# Patient Record
Sex: Female | Born: 1990 | Race: White | Hispanic: No | Marital: Single | Smoking: Current every day smoker
Health system: Southern US, Community
[De-identification: ages and names within clinical notes are randomized; demographics above are authoritative.]

## PROBLEM LIST (undated history)

## (undated) DIAGNOSIS — B958 Unspecified staphylococcus as the cause of diseases classified elsewhere: Secondary | ICD-10-CM

## (undated) DIAGNOSIS — L089 Local infection of the skin and subcutaneous tissue, unspecified: Secondary | ICD-10-CM

## (undated) DIAGNOSIS — F32A Depression, unspecified: Secondary | ICD-10-CM

## (undated) DIAGNOSIS — F329 Major depressive disorder, single episode, unspecified: Secondary | ICD-10-CM

## (undated) DIAGNOSIS — F419 Anxiety disorder, unspecified: Secondary | ICD-10-CM

## (undated) DIAGNOSIS — B009 Herpesviral infection, unspecified: Secondary | ICD-10-CM

## (undated) HISTORY — PX: NO PAST SURGERIES: SHX2092

---
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
## 2011-09-22 ENCOUNTER — Emergency Department: Payer: Self-pay | Admitting: Emergency Medicine

## 2011-09-22 LAB — TROPONIN I: Troponin-I: <0.02 ng/mL

## 2011-09-22 LAB — COMPREHENSIVE METABOLIC PANEL
Albumin: 4.3 g/dL (ref 3.4–5.0)
Alkaline Phosphatase: 107 U/L (ref 50–136)
BUN: 12 mg/dL (ref 7–18)
Bilirubin,Total: 1.8 mg/dL — ABNORMAL HIGH (ref 0.2–1.0)
Chloride: 106 mmol/L (ref 98–107)
Creatinine: 1.04 mg/dL (ref 0.60–1.30)
Glucose: 85 mg/dL (ref 65–99)
Osmolality: 275 (ref 275–301)
SGPT (ALT): 15 U/L (ref 12–78)
Sodium: 138 mmol/L (ref 136–145)
Total Protein: 8.7 g/dL — ABNORMAL HIGH (ref 6.4–8.2)

## 2011-09-22 LAB — CBC
HCT: 41.8 % (ref 35.0–47.0)
HGB: 14.3 g/dL (ref 12.0–16.0)
MCH: 29.2 pg (ref 26.0–34.0)
MCV: 86 fL (ref 80–100)
RBC: 4.89 10*6/uL (ref 3.80–5.20)

## 2011-09-22 LAB — CK TOTAL AND CKMB (NOT AT ARMC): CK, Total: 39 U/L (ref 21–215)

## 2012-03-08 ENCOUNTER — Emergency Department: Payer: Self-pay | Admitting: Unknown Physician Specialty

## 2012-03-08 LAB — CBC
HGB: 12 g/dL (ref 12.0–16.0)
MCH: 27.6 pg (ref 26.0–34.0)
RBC: 4.33 10*6/uL (ref 3.80–5.20)
RDW: 13 % (ref 11.5–14.5)
WBC: 7.3 10*3/uL (ref 3.6–11.0)

## 2012-03-08 LAB — DRUG SCREEN, URINE
Amphetamines, Ur Screen: NEGATIVE (ref ?–1000)
Barbiturates, Ur Screen: NEGATIVE (ref ?–200)
Benzodiazepine, Ur Scrn: POSITIVE (ref ?–200)
Cannabinoid 50 Ng, Ur ~~LOC~~: POSITIVE (ref ?–50)
MDMA (Ecstasy)Ur Screen: NEGATIVE (ref ?–500)
Opiate, Ur Screen: POSITIVE (ref ?–300)
Phencyclidine (PCP) Ur S: NEGATIVE (ref ?–25)

## 2012-03-08 LAB — URINALYSIS, COMPLETE
Bacteria: NONE SEEN
Bilirubin,UR: NEGATIVE
Blood: NEGATIVE
Glucose,UR: NEGATIVE mg/dL (ref 0–75)
Ketone: NEGATIVE
Leukocyte Esterase: NEGATIVE
Nitrite: NEGATIVE
RBC,UR: 1 /HPF (ref 0–5)
Specific Gravity: 1.011 (ref 1.003–1.030)
Squamous Epithelial: <1

## 2012-03-08 LAB — COMPREHENSIVE METABOLIC PANEL
Albumin: 3.9 g/dL (ref 3.4–5.0)
Alkaline Phosphatase: 89 U/L (ref 50–136)
Bilirubin,Total: 0.3 mg/dL (ref 0.2–1.0)
Calcium, Total: 9.4 mg/dL (ref 8.5–10.1)
Chloride: 107 mmol/L (ref 98–107)
Co2: 25 mmol/L (ref 21–32)
Glucose: 93 mg/dL (ref 65–99)
Osmolality: 276 (ref 275–301)
SGPT (ALT): 19 U/L (ref 12–78)
Sodium: 139 mmol/L (ref 136–145)
Total Protein: 8.6 g/dL — ABNORMAL HIGH (ref 6.4–8.2)

## 2012-03-08 LAB — SALICYLATE LEVEL: Salicylates, Serum: 1.8 mg/dL

## 2012-03-08 LAB — TSH: Thyroid Stimulating Horm: 1.38 u[IU]/mL

## 2012-03-08 LAB — ACETAMINOPHEN LEVEL: Acetaminophen: 12 ug/mL

## 2012-03-08 LAB — ETHANOL: Ethanol: <3 mg/dL

## 2012-09-05 ENCOUNTER — Ambulatory Visit: Payer: Self-pay

## 2012-09-05 LAB — URINALYSIS, COMPLETE
Bilirubin,UR: NEGATIVE
Leukocyte Esterase: NEGATIVE
Nitrite: NEGATIVE
RBC,UR: >30 /HPF (ref 0–5)

## 2012-09-05 LAB — RAPID STREP-A WITH REFLX: Micro Text Report: NEGATIVE

## 2012-09-08 LAB — BETA STREP CULTURE(ARMC)

## 2013-01-19 ENCOUNTER — Ambulatory Visit: Payer: Self-pay | Admitting: Family Medicine

## 2013-04-22 ENCOUNTER — Emergency Department: Payer: Self-pay | Admitting: Emergency Medicine

## 2013-04-22 LAB — CBC WITH DIFFERENTIAL/PLATELET
BASOS ABS: 0 10*3/uL (ref 0.0–0.1)
BASOS PCT: 0.4 %
EOS ABS: 0.2 10*3/uL (ref 0.0–0.7)
Eosinophil %: 2 %
HCT: 40.5 % (ref 35.0–47.0)
HGB: 13.8 g/dL (ref 12.0–16.0)
Lymphocyte #: 2.1 10*3/uL (ref 1.0–3.6)
Lymphocyte %: 22.6 %
MCH: 29.1 pg (ref 26.0–34.0)
MCHC: 34 g/dL (ref 32.0–36.0)
MCV: 86 fL (ref 80–100)
MONOS PCT: 6.5 %
Monocyte #: 0.6 x10 3/mm (ref 0.2–0.9)
Neutrophil #: 6.5 10*3/uL (ref 1.4–6.5)
Neutrophil %: 68.5 %
Platelet: 133 10*3/uL — ABNORMAL LOW (ref 150–440)
RBC: 4.72 10*6/uL (ref 3.80–5.20)
RDW: 14.1 % (ref 11.5–14.5)
WBC: 9.5 10*3/uL (ref 3.6–11.0)

## 2013-04-22 LAB — BASIC METABOLIC PANEL
Anion Gap: 4 — ABNORMAL LOW (ref 7–16)
BUN: 11 mg/dL (ref 7–18)
Calcium, Total: 9.4 mg/dL (ref 8.5–10.1)
Chloride: 102 mmol/L (ref 98–107)
Co2: 28 mmol/L (ref 21–32)
Creatinine: 1 mg/dL (ref 0.60–1.30)
EGFR (African American): >60
EGFR (Non-African Amer.): >60
Glucose: 104 mg/dL — ABNORMAL HIGH (ref 65–99)
OSMOLALITY: 268 (ref 275–301)
POTASSIUM: 4 mmol/L (ref 3.5–5.1)
Sodium: 134 mmol/L — ABNORMAL LOW (ref 136–145)

## 2013-05-25 ENCOUNTER — Ambulatory Visit: Payer: Self-pay

## 2013-05-25 ENCOUNTER — Other Ambulatory Visit: Payer: Self-pay | Admitting: Anesthesiology

## 2013-05-25 LAB — DRUG SCREEN, URINE
Amphetamines, Ur Screen: NEGATIVE
Barbiturates, Ur Screen: NEGATIVE
Benzodiazepine, Ur Scrn: NEGATIVE
Cannabinoid 50 Ng, Ur ~~LOC~~: NEGATIVE
Cocaine Metabolite,Ur ~~LOC~~: POSITIVE
MDMA (Ecstasy)Ur Screen: NEGATIVE
Methadone, Ur Screen: NEGATIVE
Opiate, Ur Screen: NEGATIVE
Phencyclidine (PCP) Ur S: NEGATIVE
Tricyclic, Ur Screen: NEGATIVE

## 2013-05-25 LAB — PREGNANCY, URINE: Pregnancy Test, Urine: NEGATIVE m[IU]/mL

## 2013-05-25 LAB — RAPID STREP-A WITH REFLX: Micro Text Report: NEGATIVE

## 2013-05-28 LAB — BETA STREP CULTURE(ARMC)

## 2013-11-11 ENCOUNTER — Emergency Department: Payer: Self-pay | Admitting: Emergency Medicine

## 2013-11-11 LAB — CBC WITH DIFFERENTIAL/PLATELET
Basophil #: 0 10*3/uL (ref 0.0–0.1)
Basophil %: 0.3 %
EOS PCT: 1.1 %
Eosinophil #: 0.1 10*3/uL (ref 0.0–0.7)
HCT: 36.9 % (ref 35.0–47.0)
HGB: 12.6 g/dL (ref 12.0–16.0)
Lymphocyte #: 1.7 10*3/uL (ref 1.0–3.6)
Lymphocyte %: 23.6 %
MCH: 30 pg (ref 26.0–34.0)
MCHC: 34.1 g/dL (ref 32.0–36.0)
MCV: 88 fL (ref 80–100)
MONO ABS: 0.5 x10 3/mm (ref 0.2–0.9)
Monocyte %: 7 %
NEUTROS PCT: 68 %
Neutrophil #: 5 10*3/uL (ref 1.4–6.5)
Platelet: 154 10*3/uL (ref 150–440)
RBC: 4.19 10*6/uL (ref 3.80–5.20)
RDW: 12.4 % (ref 11.5–14.5)
WBC: 7.3 10*3/uL (ref 3.6–11.0)

## 2014-02-02 DIAGNOSIS — F32A Depression, unspecified: Secondary | ICD-10-CM | POA: Insufficient documentation

## 2014-03-09 ENCOUNTER — Ambulatory Visit: Payer: Self-pay | Admitting: Physician Assistant

## 2014-06-02 DIAGNOSIS — F1990 Other psychoactive substance use, unspecified, uncomplicated: Secondary | ICD-10-CM | POA: Insufficient documentation

## 2014-06-02 DIAGNOSIS — B182 Chronic viral hepatitis C: Secondary | ICD-10-CM

## 2014-06-02 DIAGNOSIS — F1520 Other stimulant dependence, uncomplicated: Secondary | ICD-10-CM | POA: Diagnosis present

## 2014-06-02 DIAGNOSIS — L02416 Cutaneous abscess of left lower limb: Secondary | ICD-10-CM | POA: Insufficient documentation

## 2015-07-22 DIAGNOSIS — A419 Sepsis, unspecified organism: Secondary | ICD-10-CM | POA: Diagnosis present

## 2017-11-09 DIAGNOSIS — J189 Pneumonia, unspecified organism: Secondary | ICD-10-CM | POA: Diagnosis present

## 2017-11-09 DIAGNOSIS — I214 Non-ST elevation (NSTEMI) myocardial infarction: Secondary | ICD-10-CM | POA: Insufficient documentation

## 2017-11-09 DIAGNOSIS — N179 Acute kidney failure, unspecified: Secondary | ICD-10-CM | POA: Insufficient documentation

## 2017-11-09 DIAGNOSIS — J90 Pleural effusion, not elsewhere classified: Secondary | ICD-10-CM | POA: Insufficient documentation

## 2017-11-17 DIAGNOSIS — F17200 Nicotine dependence, unspecified, uncomplicated: Secondary | ICD-10-CM | POA: Insufficient documentation

## 2017-12-02 ENCOUNTER — Other Ambulatory Visit
Admission: RE | Admit: 2017-12-02 | Discharge: 2017-12-02 | Disposition: A | Discharge status: Home / Self Care | Payer: Medicaid Other | Source: Ambulatory Visit | Attending: Family Medicine | Admitting: Family Medicine

## 2017-12-02 DIAGNOSIS — N179 Acute kidney failure, unspecified: Secondary | ICD-10-CM | POA: Insufficient documentation

## 2017-12-02 LAB — COMPREHENSIVE METABOLIC PANEL
ALT: 27 U/L (ref 0–44)
ANION GAP: 10 (ref 5–15)
AST: 29 U/L (ref 15–41)
Albumin: 3.8 g/dL (ref 3.5–5.0)
Alkaline Phosphatase: 90 U/L (ref 38–126)
CO2: 26 mmol/L (ref 22–32)
Calcium: 9.6 mg/dL (ref 8.9–10.3)
Chloride: 103 mmol/L (ref 98–111)
Creatinine, Ser: 0.92 mg/dL (ref 0.44–1.00)
GFR calc non Af Amer: >60 mL/min (ref >60)
GLUCOSE: 79 mg/dL (ref 70–99)
POTASSIUM: 3.5 mmol/L (ref 3.5–5.1)
Sodium: 139 mmol/L (ref 135–145)
TOTAL PROTEIN: 7.8 g/dL (ref 6.5–8.1)
Total Bilirubin: 0.5 mg/dL (ref 0.3–1.2)

## 2018-07-03 ENCOUNTER — Encounter: Payer: Self-pay | Admitting: *Deleted

## 2018-07-03 ENCOUNTER — Other Ambulatory Visit: Payer: Self-pay

## 2018-07-03 DIAGNOSIS — N3090 Cystitis, unspecified without hematuria: Secondary | ICD-10-CM | POA: Insufficient documentation

## 2018-07-03 DIAGNOSIS — R410 Disorientation, unspecified: Secondary | ICD-10-CM | POA: Diagnosis not present

## 2018-07-03 DIAGNOSIS — Y998 Other external cause status: Secondary | ICD-10-CM | POA: Insufficient documentation

## 2018-07-03 DIAGNOSIS — Y92019 Unspecified place in single-family (private) house as the place of occurrence of the external cause: Secondary | ICD-10-CM | POA: Insufficient documentation

## 2018-07-03 DIAGNOSIS — Y9389 Activity, other specified: Secondary | ICD-10-CM | POA: Diagnosis not present

## 2018-07-03 DIAGNOSIS — S0990XA Unspecified injury of head, initial encounter: Secondary | ICD-10-CM | POA: Diagnosis present
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
## 2018-07-04 ENCOUNTER — Emergency Department: Payer: Medicaid Other

## 2018-07-04 ENCOUNTER — Emergency Department
Admission: EM | Admit: 2018-07-04 | Discharge: 2018-07-04 | Disposition: A | Discharge status: Home / Self Care | Payer: Medicaid Other | Attending: Student in an Organized Health Care Education/Training Program | Admitting: Student in an Organized Health Care Education/Training Program

## 2018-07-04 DIAGNOSIS — N3090 Cystitis, unspecified without hematuria: Secondary | ICD-10-CM

## 2018-07-04 DIAGNOSIS — S0990XA Unspecified injury of head, initial encounter: Secondary | ICD-10-CM

## 2018-07-04 LAB — URINALYSIS, COMPLETE (UACMP) WITH MICROSCOPIC
Bilirubin Urine: NEGATIVE
Glucose, UA: NEGATIVE mg/dL
Ketones, ur: NEGATIVE mg/dL
Nitrite: NEGATIVE
Protein, ur: 100 mg/dL — AB
Specific Gravity, Urine: 1.023 (ref 1.005–1.030)
WBC, UA: >50 WBC/hpf — ABNORMAL HIGH (ref 0–5)
pH: 6 (ref 5.0–8.0)

## 2018-07-04 MED ORDER — CEPHALEXIN 500 MG PO CAPS: 500.0000 mg | ORAL_CAPSULE | Freq: Three times a day (TID) | ORAL | 0 refills | Status: AC

## 2018-07-04 MED ORDER — CEPHALEXIN 500 MG PO CAPS
500.0000 mg | ORAL_CAPSULE | Freq: Once | ORAL | Status: AC
  Administered 2018-07-04: 500 mg via ORAL
  Filled 2018-07-04: qty 1

## 2018-07-04 NOTE — Discharge Instructions (Signed)
In light of COVID-19 escalating in our community, [facility] in [place] has suspended in-person client appointments and has canceled all in-person events, workshops and member events for a time to be determined.  Visit our News & Updates page for virtual events and workshops!   Our staff is working remotely and we are still here to assist you!  You can reach [place] by email at [email or web address]  We will return voicemail messages left at  [phone].
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
## 2018-07-04 NOTE — ED Notes (Addendum)
POC URINE PREG NEGATIVE.  

## 2018-07-05 ENCOUNTER — Other Ambulatory Visit: Payer: Self-pay

## 2018-07-05 ENCOUNTER — Emergency Department
Admission: EM | Admit: 2018-07-05 | Discharge: 2018-07-05 | Discharge status: Absent without leave | Payer: Medicaid Other

## 2019-02-16 ENCOUNTER — Encounter: Payer: Self-pay | Admitting: Emergency Medicine

## 2019-02-16 ENCOUNTER — Other Ambulatory Visit: Payer: Self-pay

## 2019-02-16 ENCOUNTER — Ambulatory Visit
Admission: EM | Admit: 2019-02-16 | Discharge: 2019-02-16 | Disposition: A | Discharge status: Home / Self Care | Payer: Medicaid Other | Attending: Urgent Care | Admitting: Urgent Care

## 2019-02-16 DIAGNOSIS — Z7189 Other specified counseling: Secondary | ICD-10-CM | POA: Diagnosis present

## 2019-02-16 DIAGNOSIS — U071 COVID-19: Secondary | ICD-10-CM | POA: Insufficient documentation

## 2019-02-16 DIAGNOSIS — Z20822 Contact with and (suspected) exposure to covid-19: Secondary | ICD-10-CM

## 2019-02-16 MED ORDER — ONDANSETRON 4 MG PO TBDP: 4.0000 mg | ORAL_TABLET | Freq: Three times a day (TID) | ORAL | 0 refills | Status: DC | PRN

## 2019-02-16 MED ORDER — ONDANSETRON 4 MG PO TBDP
4.0000 mg | ORAL_TABLET | Freq: Once | ORAL | Status: AC
  Administered 2019-02-16: 20:00:00 4 mg via ORAL

## 2019-02-16 NOTE — ED Triage Notes (Signed)
Pt c/o diarrhea, vomiting, headache, body aches, fever (100.4) and fatigue. started about 3 days ago. She states she has had a sinus congestion for 2 months now and is taking antibiotic for this.

## 2019-02-16 NOTE — Discharge Instructions (Signed)
It was very nice seeing you today in clinic. Thank you for entrusting me with your care.  ° °Rest and stay HYDRATED. Water and electrolyte containing beverages (Gatorade, Pedialyte) are best to prevent dehydration and electrolyte abnormalities.  °Recommend warm salt water gargles, hard candies/lozenges, and hot tea with honey/lemon to help soothe the throat and reduce irritation.  °May use Tylenol and/or Ibuprofen as needed for pain/fever.   ° °You were tested for SARS-CoV-2 (novel coronavirus) today. Testing is performed by an outside lab ([facility]) and has variable turn around times ranging between 2-5 days. Current recommendations from the the CDC and [place] DHHS require that you remain out of work in order to quarantine at home until negative test results are have been received. In the event that your test results are positive, you will be contacted with further directives. These measures are being implemented out of an abundance of caution to prevent transmission and spread during the current SARS-CoV-2 pandemic. ° °Make arrangements to follow up with your regular doctor in 1 week for re-evaluation if not improving. If your symptoms/condition worsens, please seek follow up care either here or in the ER. Please remember, our [place] providers are "right here with you" when you need us.  ° °Again, it was my pleasure to take care of you today. Thank you for choosing our clinic. I hope that you start to feel better quickly.  ° °Sharell Hilmer, MSN, APRN, FNP-C, CEN °Advanced Practice Provider °[facility] [place] [facility] °

## 2019-02-18 LAB — NOVEL CORONAVIRUS, NAA (HOSP ORDER, SEND-OUT TO REF LAB; TAT 18-24 HRS): SARS-CoV-2, NAA: DETECTED — AB

## 2019-02-18 NOTE — ED Provider Notes (Addendum)
[place], [place]   Name: Tina Barrera DOB: 11-07-1990 MRN: [id number] CSN: [id number] PCP: Jeannie Done, MD  Arrival date and time:  02/16/19 1906  Chief Complaint:  Nausea and Fever   NOTE: Prior to seeing the patient today, I have reviewed the triage nursing documentation and vital signs. Clinical staff has updated patient's PMH/PSHx, current medication list, and drug allergies/intolerances to ensure comprehensive history available to assist in medical decision making.   History:   HPI: Tina Barrera is a 29 y.o. female who presents today with complaints of fatigue, cough, chills, generalized headaches, and diffuse myalgias that started approximately 3 days ago. Patient has been running an elevated temperature, and reports that her Tmax has been 100.6. Cough has been non-productive with no associated shortness of breath or wheezing. She began having diarrhea of Wednesday of this week, following by nausea and vomiting last night. She denies abdominal pain. She notes a decreased appetite overall, however maintains the ability to tolerate oral fluids. Patient denies any perceived alterations to her sense of taste or smell. Patient presents out of concerns for her personal health after multiple exposures to people who have tested positive for SARS-CoV-2 (novel coronavirus). Patient advises that virtually family was infected with SARS-CoV-2 in December, and her sister currently has the virus. Additionally, patient is employed by [employer] where multiple people have tested positive this week. She has been tested for SARS-CoV-2 (novel coronavirus) in the past 14 days; last tested negative on 02/10/2019 per herreport. Patient has not been vaccinated for influenza this season. Of note, patient reports that she has had a sinus infection "for months". She is currently being treated with courses of amoxicillin-clavulanate and metronidazole for a sinus infection, UTI, and  bacterial vaginosis. No current urinary symptoms or vaginal discharged reported.   History reviewed. No pertinent past medical history.  Past Surgical History:  Procedure Laterality Date  . NO PAST SURGERIES      Family History  Problem Relation Age of Onset  . Healthy Mother   . Healthy Father     Social History   Tobacco Use  . Smoking status: Current Every Day Smoker  . Smokeless tobacco: Never Used  Substance Use Topics  . Alcohol use: Not Currently  . Drug use: Not Currently    There are no problems to display for this patient.   Home Medications:    Current Meds  Medication Sig  . [EXPIRED] amoxicillin-clavulanate (AUGMENTIN) 875-125 MG tablet Take by mouth.  . ARIPiprazole (ABILIFY) 10 MG tablet Take 10 mg by mouth daily.  . Buprenorphine HCl-Naloxone HCl 8-2 MG FILM Place under the tongue.  Marland Kitchen buPROPion (WELLBUTRIN SR) 150 MG 12 hr tablet Take by mouth.  Marland Kitchen FLUoxetine (PROZAC) 20 MG capsule Take by mouth.  . lamoTRIgine (LAMICTAL) 200 MG tablet Take by mouth.  . [EXPIRED] metroNIDAZOLE (FLAGYL) 500 MG tablet Take by mouth.  . QUEtiapine (SEROQUEL) 300 MG tablet Take by mouth.    Allergies:   Patient has no known allergies.  Review of Systems (ROS): Review of Systems  Constitutional: Positive for appetite change, fatigue and fever.  HENT: Positive for congestion. Negative for ear pain, postnasal drip, rhinorrhea, sinus pressure, sinus pain, sneezing and sore throat.   Eyes: Negative for pain, discharge and redness.  Respiratory: Positive for cough. Negative for chest tightness and shortness of breath.   Cardiovascular: Negative for chest pain and palpitations.  Gastrointestinal: Positive for diarrhea, nausea and vomiting. Negative for abdominal pain.  Genitourinary: Negative for dysuria, flank pain, hematuria, urgency, vaginal bleeding, vaginal discharge and vaginal pain.  Musculoskeletal: Positive for myalgias. Negative for arthralgias, back pain and neck  pain.  Skin: Negative for color change, pallor and rash.  Neurological: Negative for dizziness, syncope, weakness and headaches.  Hematological: Negative for adenopathy.     Vital Signs: Today's Vitals   02/16/19 1921 02/16/19 1927  BP:  132/84  Pulse:  94  Resp:  18  Temp:  98.9 F (37.2 C)  TempSrc:  Oral  SpO2:  97%  Weight: 121 lb (54.9 kg)   Height: 5\' 5"  (1.651 m)   PainSc: 5      Physical Exam: Physical Exam  Constitutional: She is oriented to person, place, and time and well-developed, well-nourished, and in no distress.  Acutely ill appearing; fatigued/listless.  HENT:  Head: Normocephalic and atraumatic.  Right Ear: Tympanic membrane normal.  Left Ear: Tympanic membrane normal.  Nose: Mucosal edema and rhinorrhea present. No sinus tenderness.  Mouth/Throat: Uvula is midline and mucous membranes are normal. Posterior oropharyngeal erythema (mild) present. No oropharyngeal exudate or posterior oropharyngeal edema.  Eyes: Pupils are equal, round, and reactive to light.  Cardiovascular: Normal rate, regular rhythm, normal heart sounds and intact distal pulses.  Pulmonary/Chest: Effort normal and breath sounds normal.  Mild cough noted in clinic. No SOB or increased WOB. No distress. Able to speak in complete sentences without difficulties. SPO2 97% on RA.  Neurological: She is alert and oriented to person, place, and time. Gait normal.  Skin: Skin is warm and dry. No rash noted. She is not diaphoretic.  Psychiatric: Mood, memory, affect and judgment normal.  Nursing note and vitals reviewed.   Urgent Care Treatments / Results:   Orders Placed This Encounter  Procedures  . Novel Coronavirus, NAA (Hosp order, Send-out to Ref Lab; TAT 18-24 hrs    LABS: PLEASE NOTE: all labs that were ordered this encounter are listed, however only abnormal results are displayed. NOVEL CORONAVIRUS, NAA (HOSP ORDER, SEND-OUT TO REF LAB; TAT 18-24 HRS)     EKG: -None  RADIOLOGY: No results found.  PROCEDURES: Procedures  MEDICATIONS RECEIVED THIS VISIT: Medications  ondansetron (ZOFRAN-ODT) disintegrating tablet 4 mg (4 mg Oral Given 02/16/19 1950)    PERTINENT CLINICAL COURSE NOTES/UPDATES:   Initial Impression / Assessment and Plan / Urgent Care Course:  Pertinent labs & imaging results that were available during my care of the patient were personally reviewed by me and considered in my medical decision making (see lab/imaging section of note for values and interpretations).  Tina Barrera is a 29 y.o. female who presents to [facility] today with complaints of Nausea and Fever  Patient overall well appearing and in no acute distress today in clinic. Presenting symptoms (see HPI) and exam as documented above. She presents with symptoms associated with SARS-CoV-2 (novel coronavirus) following multiple personal and occupational exposures. Discussed typical symptom constellation. Reviewed potential for infection and need for testing. Patient amenable to being tested. SARS-CoV-2 swab collected by certified clinical staff. Discussed variable turn around times associated with testing, as swabs are being processed at [facility], and have been taking between 24-48 hours to come back. She was advised to self quarantine, per [facility] DHHS guidelines, until negative results received. These measures are being implemented out of an abundance of caution to prevent transmission and spread during the current SARS-CoV-2 pandemic.  Presenting symptoms are highly suspicious for SARS-CoV-2. I discussed with her that her symptoms are felt  to be viral in nature, and she is already on antibiotics, thus additional antibiotics would not offer her any relief or improve her symptoms any faster than conservative symptomatic management.  Intervention for cough offered, however patient declined citing that her symptoms are mild/controlled. Discussed supportive care  measures at home during acute phase of illness. Patient to rest as much as possible. She was encouraged to ensure adequate hydration (water and ORS) to prevent dehydration and electrolyte derangements. Will send in Rx for supply of ondansetron for PRN use for nausea. Pharmacy closed tonight; single 4 mg ODT dose sent home with patient in the event that she needs it through the night. Patient may use APAP and/or IBU on an as needed basis for pain/fever. May continue OTC decongestants as needed.   Current clinical condition warrants patient being out of work in order to quarantine while waiting for testing results. She was provided with the appropriate documentation to provide to her place of employment that will allow for her to RTW on 02/19/2019 with no restrictions. RTW is contingent on her SARS-CoV-2 test results being reviewed as negative.     Discussed follow up with primary care physician in 1 week for re-evaluation. I have reviewed the follow up and strict return precautions for any new or worsening symptoms. Patient is aware of symptoms that would be deemed urgent/emergent, and would thus require further evaluation either here or in the emergency department. At the time of discharge, she verbalized understanding and consent with the discharge plan as it was reviewed with her. All questions were fielded by provider and/or clinic staff prior to patient discharge.    Final Clinical Impressions / Urgent Care Diagnoses:   Final diagnoses:  Suspected COVID-19 virus infection  Exposure to COVID-19 virus  Encounter for laboratory testing for COVID-19 virus  Advice given about COVID-19 virus infection    New Prescriptions:  [place] Controlled Substance Registry consulted? Not Applicable  Meds ordered this encounter  Medications  . ondansetron (ZOFRAN-ODT) disintegrating tablet 4 mg  . ondansetron (ZOFRAN-ODT) 4 MG disintegrating tablet    Sig: Take 1 tablet (4 mg total) by mouth every 8 (eight) hours  as needed.    Dispense:  15 tablet    Refill:  0    Recommended Follow up Care:  Patient encouraged to follow up with the following provider within the specified time frame, or sooner as dictated by the severity of her symptoms. As always, she was instructed that for any urgent/emergent care needs, she should seek care either here or in the emergency department for more immediate evaluation.  Follow-up Information    Polanco, Gelene Mink, MD In 1 week.   Specialty: Family Medicine Why: General reassessment of symptoms if not improving Contact information: [address] [place] [address] [phone]         NOTE: This note was prepared using Dragon dictation software along with smaller phrase technology. Despite my best ability to proofread, there is the potential that transcriptional errors may still occur from this process, and are completely unintentional.    Verlee Monte, NP 02/18/19 1308

## 2019-02-19 ENCOUNTER — Telehealth (HOSPITAL_COMMUNITY): Payer: Self-pay | Admitting: Emergency Medicine

## 2019-02-19 NOTE — Telephone Encounter (Signed)
Your test for COVID-19 was positive, meaning that you were infected with the novel coronavirus and could give the germ to others.  Please continue isolation at home for at least 10 days since the start of your symptoms. If you do not have symptoms, please isolate at home for 10 days from the day you were tested. Once you complete your 10 day quarantine, you may return to normal activities as long as you've not had a fever for over 24 hours(without taking fever reducing medicine) and your symptoms are improving. Please continue good preventive care measures, including:  frequent hand-washing, avoid touching your face, cover coughs/sneezes, stay out of crowds and keep a 6 foot distance from others.  Go to the nearest hospital emergency room if fever/cough/breathlessness are severe or illness seems like a threat to life.  Patient contacted by phone and made aware of    results. Pt verbalized understanding and had all questions answered.  Pt states she would like her mother to come pick up a doctors note. Gave permission for patricia Hershey to pick up note for work for her at the office.

## 2019-02-24 ENCOUNTER — Other Ambulatory Visit: Payer: Self-pay

## 2019-02-24 ENCOUNTER — Ambulatory Visit
Admission: EM | Admit: 2019-02-24 | Discharge: 2019-02-24 | Disposition: A | Discharge status: Home / Self Care | Payer: Medicaid Other | Attending: Family Medicine | Admitting: Family Medicine

## 2019-02-24 DIAGNOSIS — R300 Dysuria: Secondary | ICD-10-CM | POA: Diagnosis not present

## 2019-02-24 LAB — URINALYSIS, COMPLETE (UACMP) WITH MICROSCOPIC
Bacteria, UA: NONE SEEN
Glucose, UA: NEGATIVE mg/dL
Ketones, ur: NEGATIVE mg/dL
Leukocytes,Ua: NEGATIVE
Nitrite: NEGATIVE
Protein, ur: 30 mg/dL — AB
RBC / HPF: >50 RBC/hpf (ref 0–5)
Specific Gravity, Urine: >1.03 — ABNORMAL HIGH (ref 1.005–1.030)
pH: 6 (ref 5.0–8.0)

## 2019-02-24 MED ORDER — CEPHALEXIN 500 MG PO CAPS: 500.0000 mg | ORAL_CAPSULE | Freq: Two times a day (BID) | ORAL | 0 refills | Status: DC

## 2019-02-24 NOTE — Discharge Instructions (Signed)
Increase fluid intake.  Antibiotic as prescribed.  If symptoms persist, you will need to see Urology.  Take care  Dr. Adriana Simas

## 2019-02-24 NOTE — ED Triage Notes (Signed)
Pt presents with c/o continued urinary symptoms including dysuria and hematuria. She was treated for UTI 02/10/19. Pt reports she was given Augmentin. Pt states she has not improved. Pt has no concerns for STI's. Pt reports dysuria for about 3 weeks.

## 2019-02-24 NOTE — ED Provider Notes (Addendum)
[facility]    CSN: [id number] Arrival date & time: 02/24/19  1459      History   Chief Complaint Chief Complaint  Patient presents with  . Dysuria   HPI  29 year old female presents with ongoing dysuria.  Patient states that she was treated for UTI 1/30.  Office visit was reviewed.  She was placed on Augmentin to cover for UTI as well as sinusitis.  Urine culture was negative.  STD testing negative.  Wet prep revealed bacterial vaginosis.  She was also placed on Flagyl.  Patient reports that she had improvement in her symptoms but feels like it never truly resolved.  Symptoms continue to persist.  She reports dysuria and dark urine.  She believes that she has blood in her urine.  No fever.  No abdominal pain.  Rates her pain as 7/10 in severity.  No other complaints or concerns at this time.  Past Surgical History:  Procedure Laterality Date  . NO PAST SURGERIES      OB History   No obstetric history on file.    Home Medications    Prior to Admission medications   Medication Sig Start Date End Date Taking? Authorizing Provider  ARIPiprazole (ABILIFY) 10 MG tablet Take 10 mg by mouth daily. 09/21/18  Yes [provider]  Buprenorphine HCl-Naloxone HCl 8-2 MG FILM Place under the tongue.   Yes [provider]  buPROPion (WELLBUTRIN SR) 150 MG 12 hr tablet Take by mouth.   Yes [provider]  FLUoxetine (PROZAC) 20 MG capsule Take by mouth.   Yes [provider]  lamoTRIgine (LAMICTAL) 200 MG tablet Take by mouth. 06/10/17  Yes [provider]  ondansetron (ZOFRAN-ODT) 4 MG disintegrating tablet Take 1 tablet (4 mg total) by mouth every 8 (eight) hours as needed. 02/16/19  Yes Verlee Monte, NP  QUEtiapine (SEROQUEL) 300 MG tablet Take by mouth. 06/10/17  Yes [provider]  cephALEXin (KEFLEX) 500 MG capsule Take 1 capsule (500 mg total) by mouth 2 (two) times daily. 02/24/19   Tommie Sams, DO    Family  History Family History  Problem Relation Age of Onset  . Healthy Mother   . Healthy Father     Social History Social History   Tobacco Use  . Smoking status: Current Every Day Smoker  . Smokeless tobacco: Never Used  Substance Use Topics  . Alcohol use: Not Currently  . Drug use: Not Currently     Allergies   Patient has no known allergies.   Review of Systems Review of Systems  Constitutional: Negative.   Gastrointestinal: Negative.   Genitourinary: Positive for dysuria and hematuria.   Physical Exam Triage Vital Signs ED Triage Vitals  Enc Vitals Group     BP 02/24/19 1517 110/81     Pulse Rate 02/24/19 1517 (!) 120     Resp --      Temp 02/24/19 1517 98.7 F (37.1 C)     Temp Source 02/24/19 1517 Oral     SpO2 02/24/19 1517 100 %     Weight 02/24/19 1515 121 lb (54.9 kg)     Height 02/24/19 1515 5\' 5"  (1.651 m)     Head Circumference --      Peak Flow --      Pain Score 02/24/19 1515 7     Pain Loc --      Pain Edu? --      Excl. in GC? --  Updated Vital Signs BP 110/81 (BP Location: Left Arm)   Pulse (!) 120   Temp 98.7 F (37.1 C) (Oral)   Ht 5\' 5"  (1.651 m)   Wt 54.9 kg   LMP 02/12/2019 (Approximate)   SpO2 100%   BMI 20.14 kg/m   Visual Acuity Right Eye Distance:   Left Eye Distance:   Bilateral Distance:    Right Eye Near:   Left Eye Near:    Bilateral Near:     Physical Exam Vitals and nursing note reviewed.  Constitutional:      General: She is not in acute distress.    Appearance: Normal appearance. She is not ill-appearing.  HENT:     Head: Normocephalic and atraumatic.  Eyes:     General:        Right eye: No discharge.        Left eye: No discharge.     Conjunctiva/sclera: Conjunctivae normal.  Cardiovascular:     Rate and Rhythm: Regular rhythm. Tachycardia present.     Heart sounds: No murmur.  Pulmonary:     Effort: Pulmonary effort is normal.     Breath sounds: Normal breath sounds. No wheezing, rhonchi or  rales.  Abdominal:     General: There is no distension.     Palpations: Abdomen is soft.     Tenderness: There is no abdominal tenderness.  Neurological:     Mental Status: She is alert.  Psychiatric:        Mood and Affect: Mood normal.        Behavior: Behavior normal.    UC Treatments / Results  Labs (all labs ordered are listed, but only abnormal results are displayed) Labs Reviewed  URINALYSIS, COMPLETE (UACMP) WITH MICROSCOPIC - Abnormal; Notable for the following components:      Result Value   Color, Urine AMBER (*)    APPearance HAZY (*)    Specific Gravity, Urine >1.030 (*)    Hgb urine dipstick LARGE (*)    Bilirubin Urine SMALL (*)    Protein, ur 30 (*)    All other components within normal limits  URINE CULTURE    EKG   Radiology No results found.  Procedures Procedures (including critical care time)  Medications Ordered in UC Medications - No data to display  Initial Impression / Assessment and Plan / UC Course  I have reviewed the triage vital signs and the nursing notes.  Pertinent labs & imaging results that were available during my care of the patient were reviewed by me and considered in my medical decision making (see chart for details).    29 year old female presents with dysuria.  Urine microscopy revealed 6-10 WBCs, mucus, hyaline casts, and hematuria.  Placing empirically on Keflex while awaiting culture.  Final Clinical Impressions(s) / UC Diagnoses   Final diagnoses:  Dysuria     Discharge Instructions     Increase fluid intake.  Antibiotic as prescribed.  If symptoms persist, you will need to see Urology.  Take care  Dr. Lacinda Axon    ED Prescriptions    Medication Sig Dispense Auth. Provider   cephALEXin (KEFLEX) 500 MG capsule Take 1 capsule (500 mg total) by mouth 2 (two) times daily. 14 capsule Thersa Salt G, DO     PDMP not reviewed this encounter.      Coral Spikes, [place] 02/24/19 1558

## 2019-02-26 LAB — URINE CULTURE

## 2019-03-28 DIAGNOSIS — F17200 Nicotine dependence, unspecified, uncomplicated: Secondary | ICD-10-CM | POA: Insufficient documentation

## 2019-03-28 DIAGNOSIS — L03113 Cellulitis of right upper limb: Secondary | ICD-10-CM | POA: Diagnosis present

## 2019-04-08 ENCOUNTER — Ambulatory Visit
Admission: EM | Admit: 2019-04-08 | Discharge: 2019-04-08 | Disposition: A | Discharge status: Home / Self Care | Payer: Medicaid Other

## 2019-04-08 ENCOUNTER — Other Ambulatory Visit: Payer: Self-pay

## 2019-04-08 DIAGNOSIS — A6000 Herpesviral infection of urogenital system, unspecified: Secondary | ICD-10-CM | POA: Diagnosis not present

## 2019-04-08 HISTORY — DX: Unspecified staphylococcus as the cause of diseases classified elsewhere: B95.8

## 2019-04-08 HISTORY — DX: Anxiety disorder, unspecified: F41.9

## 2019-04-08 HISTORY — DX: Depression, unspecified: F32.A

## 2019-04-08 HISTORY — DX: Local infection of the skin and subcutaneous tissue, unspecified: L08.9

## 2019-04-08 HISTORY — DX: Herpesviral infection, unspecified: B00.9

## 2019-04-08 MED ORDER — VALACYCLOVIR HCL 1 G PO TABS: 1000.0000 mg | ORAL_TABLET | Freq: Two times a day (BID) | ORAL | 0 refills | Status: AC

## 2019-04-08 NOTE — Discharge Instructions (Addendum)
It was very nice seeing you today in clinic. Thank you for entrusting me with your care.   Please utilize the medications that we discussed. Your prescriptions has been called in to your pharmacy.   Make arrangements to follow up with your regular doctor in 1 week for re-evaluation if not improving. If your symptoms/condition worsens, please seek follow up care either here or in the ER. Please remember, our [place] providers are "right here with you" when you need us.   Again, it was my pleasure to take care of you today. Thank you for choosing our clinic. I hope that you start to feel better quickly.   Nur Rabold, MSN, APRN, FNP-C, CEN Advanced Practice Provider [facility] 

## 2019-04-08 NOTE — ED Triage Notes (Signed)
Pt states she is having a herpes outbreak in genital area.

## 2019-04-09 NOTE — ED Provider Notes (Signed)
[place], [place]   Name: Tina Barrera DOB: 1990/08/13 MRN: [id number] CSN: [id number] PCP: Jeannie Done, MD  Arrival date and time:  04/08/19 1309  Chief Complaint:  Genital herpes  NOTE: Prior to seeing the patient today, I have reviewed the triage nursing documentation and vital signs. Clinical staff has updated patient's PMH/PSHx, current medication list, and drug allergies/intolerances to ensure comprehensive history available to assist in medical decision making.   History:   HPI: Tina Barrera is a 29 y.o. female who presents today with complaints of an outbreak of her known genital herpes. Symptoms started approximately 2 weeks ago, however over the course of the last week, patient has developed "big painful blisters". Patient advising that she has had many flares in the past and this is similar to previous outbreaks. No fevers, chills, or abdominal pain. She denies urinary symptoms, vaginal bleeding, and vaginal discharge. She does not have any other medical complaints or needs today. Despite her symptoms, patient has not taken any over the counter interventions to help improve/relieve her reported pain at home.  There is nothing that patient has found to reduce her symptoms other than antiviral therapy.   Past Medical History:  Diagnosis Date  . Anxiety   . Depression   . Herpes   . Staph skin infection     Past Surgical History:  Procedure Laterality Date  . NO PAST SURGERIES      Family History  Problem Relation Age of Onset  . Healthy Mother   . Healthy Father     Social History   Tobacco Use  . Smoking status: Current Every Day Smoker    Packs/day: 1.00    Types: Cigarettes  . Smokeless tobacco: Never Used  Substance Use Topics  . Alcohol use: Not Currently  . Drug use: Yes    Types: Methamphetamines    Comment: A few days ago    There are no problems to display for this patient.   Home Medications:    Current Meds  Medication  Sig  . doxycycline (VIBRAMYCIN) 100 MG capsule Take by mouth.    Allergies:   Patient has no known allergies.  Review of Systems (ROS):  Review of systems NEGATIVE unless otherwise noted in narrative H&P section.   Vital Signs: Today's Vitals   04/08/19 1318 04/08/19 1320 04/08/19 1321 04/08/19 1341  BP: (!) 131/97     Pulse: 80     Resp: 16     Temp: 97.9 F (36.6 C)     TempSrc: Oral     SpO2: 100%     Weight:   120 lb (54.4 kg)   Height:   5\' 5"  (1.651 m)   PainSc:  8   8     Physical Exam: Physical Exam  Constitutional: She is oriented to person, place, and time and well-developed, well-nourished, and in no distress.  HENT:  Head: Normocephalic and atraumatic.  Eyes: Pupils are equal, round, and reactive to light.  Cardiovascular: Normal rate, regular rhythm, normal heart sounds and intact distal pulses.  Pulmonary/Chest: Effort normal and breath sounds normal.  Abdominal: Soft. Normal appearance and bowel sounds are normal. She exhibits no distension. There is no abdominal tenderness. There is no CVA tenderness.  Genitourinary:    Genitourinary Comments: Exam deferred. (+) typical herpetic lesions to vagina reported. No bleeding. Patient is not currently pregnant.    Neurological: She is alert and oriented to person, place, and time. Gait normal.  Skin: Skin  is warm and dry. No rash noted. She is not diaphoretic.  Psychiatric: Memory, affect and judgment normal. Her mood appears anxious.  Nursing note and vitals reviewed.  Urgent Care Treatments / Results:   No orders of the defined types were placed in this encounter.   LABS: PLEASE NOTE: all labs that were ordered this encounter are listed, however only abnormal results are displayed. Labs Reviewed - No data to display  EKG: -None  RADIOLOGY: No results found.  PROCEDURES: Procedures  MEDICATIONS RECEIVED THIS VISIT: Medications - No data to display  PERTINENT CLINICAL COURSE NOTES/UPDATES:    Initial Impression / Assessment and Plan / Urgent Care Course:  Pertinent labs & imaging results that were available during my care of the patient were personally reviewed by me and considered in my medical decision making (see lab/imaging section of note for values and interpretations).  Tina Barrera is a 29 y.o. female who presents to [facility] today with complaints of Genital herpes  Patient is well appearing overall in clinic today. She does not appear to be in any acute distress. Presenting symptoms (see HPI) and exam as documented above. Patient presents in the midst of a flare of her known genital herpes. Will treat with a 5 day course of valacyclovir (1 gram BID). May use Tylenol and/or Ibuprofen as needed for pain.   Discussed follow up with primary care physician in 1 week for re-evaluation. I have reviewed the follow up and strict return precautions for any new or worsening symptoms. Patient is aware of symptoms that would be deemed urgent/emergent, and would thus require further evaluation either here or in the emergency department. At the time of discharge, she verbalized understanding and consent with the discharge plan as it was reviewed with her. All questions were fielded by provider and/or clinic staff prior to patient discharge.    Final Clinical Impressions / Urgent Care Diagnoses:   Final diagnoses:  Genital herpes simplex, unspecified site    New Prescriptions:  [place] Controlled Substance Registry consulted? Not Applicable  Meds ordered this encounter  Medications  . valACYclovir (VALTREX) 1000 MG tablet    Sig: Take 1 tablet (1,000 mg total) by mouth 2 (two) times daily for 5 days.    Dispense:  10 tablet    Refill:  0    Recommended Follow up Care:  Patient encouraged to follow up with the following provider within the specified time frame, or sooner as dictated by the severity of her symptoms. As always, she was instructed that for any  urgent/emergent care needs, she should seek care either here or in the emergency department for more immediate evaluation.  Follow-up Information    Polanco, Shari Heritage, MD In 1 week.   Specialty: Family Medicine Why: General reassessment of symptoms if not improving Contact information: [address] [phone]         NOTE: This note was prepared using Dragon dictation software along with smaller phrase technology. Despite my best ability to proofread, there is the potential that transcriptional errors may still occur from this process, and are completely unintentional.    Karen Kitchens, NP 04/09/19 2112

## 2019-11-14 DIAGNOSIS — F321 Major depressive disorder, single episode, moderate: Secondary | ICD-10-CM | POA: Insufficient documentation

## 2019-11-14 DIAGNOSIS — Z87898 Personal history of other specified conditions: Secondary | ICD-10-CM | POA: Insufficient documentation

## 2019-11-14 DIAGNOSIS — Z719 Counseling, unspecified: Secondary | ICD-10-CM | POA: Insufficient documentation

## 2019-11-30 ENCOUNTER — Emergency Department: Payer: Medicaid Other

## 2019-11-30 ENCOUNTER — Encounter: Payer: Self-pay | Admitting: Emergency Medicine

## 2019-11-30 ENCOUNTER — Inpatient Hospital Stay
Admission: EM | Admit: 2019-11-30 | Discharge: 2019-12-02 | DRG: 872 | Disposition: A | Discharge status: Home / Self Care | Payer: Medicaid Other | Source: Ambulatory Visit | Attending: Internal Medicine | Admitting: Internal Medicine

## 2019-11-30 ENCOUNTER — Other Ambulatory Visit: Payer: Self-pay

## 2019-11-30 ENCOUNTER — Observation Stay: Payer: Medicaid Other

## 2019-11-30 DIAGNOSIS — F1590 Other stimulant use, unspecified, uncomplicated: Secondary | ICD-10-CM | POA: Diagnosis present

## 2019-11-30 DIAGNOSIS — F17290 Nicotine dependence, other tobacco product, uncomplicated: Secondary | ICD-10-CM | POA: Diagnosis present

## 2019-11-30 DIAGNOSIS — L02512 Cutaneous abscess of left hand: Secondary | ICD-10-CM | POA: Diagnosis present

## 2019-11-30 DIAGNOSIS — L03119 Cellulitis of unspecified part of limb: Secondary | ICD-10-CM | POA: Diagnosis not present

## 2019-11-30 DIAGNOSIS — L03114 Cellulitis of left upper limb: Secondary | ICD-10-CM | POA: Diagnosis not present

## 2019-11-30 DIAGNOSIS — F319 Bipolar disorder, unspecified: Secondary | ICD-10-CM

## 2019-11-30 DIAGNOSIS — A419 Sepsis, unspecified organism: Principal | ICD-10-CM | POA: Diagnosis present

## 2019-11-30 DIAGNOSIS — F1190 Opioid use, unspecified, uncomplicated: Secondary | ICD-10-CM | POA: Diagnosis present

## 2019-11-30 DIAGNOSIS — F17210 Nicotine dependence, cigarettes, uncomplicated: Secondary | ICD-10-CM | POA: Diagnosis present

## 2019-11-30 DIAGNOSIS — L02519 Cutaneous abscess of unspecified hand: Secondary | ICD-10-CM | POA: Diagnosis not present

## 2019-11-30 DIAGNOSIS — F1910 Other psychoactive substance abuse, uncomplicated: Secondary | ICD-10-CM | POA: Diagnosis present

## 2019-11-30 DIAGNOSIS — B182 Chronic viral hepatitis C: Secondary | ICD-10-CM

## 2019-11-30 DIAGNOSIS — L03113 Cellulitis of right upper limb: Secondary | ICD-10-CM | POA: Diagnosis present

## 2019-11-30 DIAGNOSIS — Z79899 Other long term (current) drug therapy: Secondary | ICD-10-CM

## 2019-11-30 DIAGNOSIS — Z20822 Contact with and (suspected) exposure to covid-19: Secondary | ICD-10-CM | POA: Diagnosis present

## 2019-11-30 DIAGNOSIS — F1520 Other stimulant dependence, uncomplicated: Secondary | ICD-10-CM | POA: Diagnosis present

## 2019-11-30 DIAGNOSIS — F419 Anxiety disorder, unspecified: Secondary | ICD-10-CM | POA: Diagnosis present

## 2019-11-30 LAB — URINALYSIS, COMPLETE (UACMP) WITH MICROSCOPIC
Bacteria, UA: NONE SEEN
Bilirubin Urine: NEGATIVE
Glucose, UA: NEGATIVE mg/dL
Ketones, ur: NEGATIVE mg/dL
Leukocytes,Ua: NEGATIVE
Nitrite: NEGATIVE
Protein, ur: NEGATIVE mg/dL
Specific Gravity, Urine: 1.021 (ref 1.005–1.030)
pH: 5 (ref 5.0–8.0)

## 2019-11-30 LAB — PROTIME-INR
INR: 1.1 (ref 0.8–1.2)
Prothrombin Time: 13.5 seconds (ref 11.4–15.2)

## 2019-11-30 LAB — CBC WITH DIFFERENTIAL/PLATELET
Abs Immature Granulocytes: 0.07 10*3/uL (ref 0.00–0.07)
Basophils Absolute: 0 10*3/uL (ref 0.0–0.1)
Basophils Relative: 0 %
Eosinophils Absolute: 0.1 10*3/uL (ref 0.0–0.5)
Eosinophils Relative: 1 %
HCT: 35.5 % — ABNORMAL LOW (ref 36.0–46.0)
Hemoglobin: 12.6 g/dL (ref 12.0–15.0)
Immature Granulocytes: 1 %
Lymphocytes Relative: 20 %
Lymphs Abs: 2.3 10*3/uL (ref 0.7–4.0)
MCH: 29.5 pg (ref 26.0–34.0)
MCHC: 35.5 g/dL (ref 30.0–36.0)
MCV: 83.1 fL (ref 80.0–100.0)
Monocytes Absolute: 0.6 10*3/uL (ref 0.1–1.0)
Monocytes Relative: 5 %
Neutro Abs: 8.7 10*3/uL — ABNORMAL HIGH (ref 1.7–7.7)
Neutrophils Relative %: 73 %
Platelets: 221 10*3/uL (ref 150–400)
RBC: 4.27 MIL/uL (ref 3.87–5.11)
RDW: 12 % (ref 11.5–15.5)
WBC: 11.8 10*3/uL — ABNORMAL HIGH (ref 4.0–10.5)
nRBC: 0 % (ref 0.0–0.2)

## 2019-11-30 LAB — COMPREHENSIVE METABOLIC PANEL
ALT: 70 U/L — ABNORMAL HIGH (ref 0–44)
AST: 136 U/L — ABNORMAL HIGH (ref 15–41)
Albumin: 4.2 g/dL (ref 3.5–5.0)
Alkaline Phosphatase: 68 U/L (ref 38–126)
Anion gap: 11 (ref 5–15)
BUN: 15 mg/dL (ref 6–20)
CO2: 23 mmol/L (ref 22–32)
Calcium: 9.2 mg/dL (ref 8.9–10.3)
Chloride: 99 mmol/L (ref 98–111)
Creatinine, Ser: 0.93 mg/dL (ref 0.44–1.00)
GFR, Estimated: >60 mL/min (ref >60)
Glucose, Bld: 123 mg/dL — ABNORMAL HIGH (ref 70–99)
Potassium: 3.2 mmol/L — ABNORMAL LOW (ref 3.5–5.1)
Sodium: 133 mmol/L — ABNORMAL LOW (ref 135–145)
Total Bilirubin: 1.3 mg/dL — ABNORMAL HIGH (ref 0.3–1.2)
Total Protein: 7.7 g/dL (ref 6.5–8.1)

## 2019-11-30 LAB — RESP PANEL BY RT-PCR (FLU A&B, COVID) ARPGX2
Influenza A by PCR: NEGATIVE
Influenza B by PCR: NEGATIVE
SARS Coronavirus 2 by RT PCR: NEGATIVE

## 2019-11-30 LAB — LACTIC ACID, PLASMA
Lactic Acid, Venous: 1.2 mmol/L (ref 0.5–1.9)
Lactic Acid, Venous: 1.5 mmol/L (ref 0.5–1.9)

## 2019-11-30 LAB — APTT: aPTT: 28 seconds (ref 24–36)

## 2019-11-30 MED ORDER — LIDOCAINE HCL (PF) 1 % IJ SOLN
INTRAMUSCULAR | Status: AC
  Administered 2019-11-30: 5 mL via INTRADERMAL
  Filled 2019-11-30: qty 5

## 2019-11-30 MED ORDER — LAMOTRIGINE 100 MG PO TABS
200.0000 mg | ORAL_TABLET | Freq: Two times a day (BID) | ORAL | Status: DC
  Filled 2019-11-30: qty 8
  Filled 2019-11-30 (×2): qty 2
  Filled 2019-11-30: qty 8
  Filled 2019-11-30: qty 2

## 2019-11-30 MED ORDER — LIDOCAINE HCL (PF) 1 % IJ SOLN: 5.0000 mL | Freq: Once | INTRAMUSCULAR | Status: AC

## 2019-11-30 MED ORDER — SODIUM CHLORIDE 0.9 % IV SOLN
2.0000 g | Freq: Once | INTRAVENOUS | Status: AC
  Administered 2019-11-30: 2 g via INTRAVENOUS
  Filled 2019-11-30: qty 20

## 2019-11-30 MED ORDER — BUPROPION HCL ER (SR) 150 MG PO TB12
150.0000 mg | ORAL_TABLET | Freq: Every day | ORAL | Status: DC
  Filled 2019-11-30 (×3): qty 1

## 2019-11-30 MED ORDER — DIVALPROEX SODIUM ER 500 MG PO TB24
500.0000 mg | ORAL_TABLET | Freq: Two times a day (BID) | ORAL | Status: DC
  Filled 2019-11-30: qty 1

## 2019-11-30 MED ORDER — SODIUM CHLORIDE 0.9 % IV SOLN
2.0000 g | Freq: Three times a day (TID) | INTRAVENOUS | Status: DC
  Administered 2019-11-30 – 2019-12-02 (×5): 2 g via INTRAVENOUS
  Filled 2019-11-30 (×7): qty 2

## 2019-11-30 MED ORDER — VANCOMYCIN HCL 1500 MG/300ML IV SOLN
1500.0000 mg | Freq: Once | INTRAVENOUS | Status: AC
  Administered 2019-11-30: 1500 mg via INTRAVENOUS
  Filled 2019-11-30: qty 300

## 2019-11-30 MED ORDER — ATOMOXETINE HCL 10 MG PO CAPS
40.0000 mg | ORAL_CAPSULE | Freq: Two times a day (BID) | ORAL | Status: DC
  Filled 2019-11-30: qty 4

## 2019-11-30 MED ORDER — LIDOCAINE-PRILOCAINE 2.5-2.5 % EX CREA: TOPICAL_CREAM | Freq: Once | CUTANEOUS | Status: AC

## 2019-11-30 MED ORDER — VANCOMYCIN HCL 500 MG/100ML IV SOLN
500.0000 mg | Freq: Three times a day (TID) | INTRAVENOUS | Status: DC
  Administered 2019-11-30 – 2019-12-01 (×2): 500 mg via INTRAVENOUS
  Filled 2019-11-30 (×4): qty 100

## 2019-11-30 MED ORDER — IOHEXOL 300 MG/ML  SOLN
100.0000 mL | Freq: Once | INTRAMUSCULAR | Status: AC | PRN
  Administered 2019-11-30: 100 mL via INTRAVENOUS

## 2019-11-30 MED ORDER — BUPRENORPHINE HCL-NALOXONE HCL 8-2 MG SL SUBL
1.0000 | SUBLINGUAL_TABLET | Freq: Three times a day (TID) | SUBLINGUAL | Status: DC
  Administered 2019-11-30 – 2019-12-02 (×5): 1 via SUBLINGUAL
  Filled 2019-11-30 (×11): qty 1

## 2019-11-30 MED ORDER — LIDOCAINE 4 % EX CREA
TOPICAL_CREAM | Freq: Once | CUTANEOUS | Status: DC
  Filled 2019-11-30: qty 5

## 2019-11-30 MED ORDER — ENOXAPARIN SODIUM 40 MG/0.4ML ~~LOC~~ SOLN
40.0000 mg | SUBCUTANEOUS | Status: DC
  Administered 2019-11-30 – 2019-12-01 (×2): 40 mg via SUBCUTANEOUS
  Filled 2019-11-30 (×2): qty 0.4

## 2019-11-30 MED ORDER — QUETIAPINE FUMARATE 300 MG PO TABS
300.0000 mg | ORAL_TABLET | Freq: Every day | ORAL | Status: DC
  Administered 2019-11-30 – 2019-12-01 (×2): 300 mg via ORAL
  Filled 2019-11-30 (×3): qty 1

## 2019-11-30 MED ORDER — SODIUM CHLORIDE 0.9 % IV BOLUS
1000.0000 mL | Freq: Once | INTRAVENOUS | Status: AC
  Administered 2019-11-30: 1000 mL via INTRAVENOUS

## 2019-11-30 MED ORDER — FLUOXETINE HCL 20 MG PO CAPS
20.0000 mg | ORAL_CAPSULE | Freq: Every day | ORAL | Status: DC
  Administered 2019-12-01 – 2019-12-02 (×2): 20 mg via ORAL
  Filled 2019-11-30 (×2): qty 1

## 2019-11-30 MED ORDER — LIDOCAINE-PRILOCAINE 2.5-2.5 % EX CREA
TOPICAL_CREAM | CUTANEOUS | Status: AC
  Administered 2019-11-30: 1 via TOPICAL
  Filled 2019-11-30: qty 5

## 2019-11-30 MED ORDER — VANCOMYCIN HCL IN DEXTROSE 1-5 GM/200ML-% IV SOLN: 1000.0000 mg | Freq: Once | INTRAVENOUS | Status: DC

## 2019-11-30 MED ORDER — ARIPIPRAZOLE 15 MG PO TABS
30.0000 mg | ORAL_TABLET | Freq: Every day | ORAL | Status: DC
  Administered 2019-12-01 – 2019-12-02 (×2): 30 mg via ORAL
  Filled 2019-11-30 (×2): qty 2

## 2019-11-30 NOTE — ED Triage Notes (Addendum)
Pt to ER with c/o "sinus infection and skin infection".  Pt states she has had a head cold for last week that has gotten progressively worse.  At walk-in, pt was note to be tachycardic.  Pt denies chest pain or SHOB.    Pt states used Meth yesterday

## 2019-11-30 NOTE — H&P (Addendum)
History and Physical   Tina Barrera HAF:[id number] DOB: 06-23-1990 DOA: 11/30/2019  PCP: Enid Baas, MD  Outpatient Specialists: Dr. Willa Rough, psychiatrist in [facility] Patient coming from: Home  I have personally briefly reviewed patient's old medical records in [facility] EMR.  Chief Concern: From clinic for abnormal heart rate  HPI: Tina Barrera is a 29 y.o. female with medical history significant for IV drug user, anxiety, bipolar disorder, depression presented to the emergency department for chief concerns of rapid heart rate.  She presented to [facility] clinic for chief concerns of sinus infection and skin infection.  At the clinic, it was noted that she had a rapid heart rate and they sent her to the emergency department.  She states she injected in multiple places and then it got red immediately. She reports that she did not inject anymore before 11/29/19 and that she has been clean since March 2021.  She reports that she started injecting meth on 11/29/2019.  She states that she was a prior IV heroin user and that she has not used IV heroin in 5 years because she is now on Suboxone.  She denies subjective fever, shortness of breath, chest pain, nausea, vomiting, chills, headache, dizziness, abdominal pain, changes to bowel movements, dysuria, hematuria, changes to urinary frequency.   She reports last bowel movememnt was 11/28/19 and it was loose, brown in color.   Social history: lives with boyfriend.  She has a 34-year-old son.  Endorses smoking 1 pack per day (started at age 65), denies etoh use, endorses history of IV heroin use (last used 5 years ago) and current use of meth.   ED Course: Discussed with ED provider, admitting for cellulitis. ED provider was concerned for indurations and joints of the hands and did an I&D at bedside.  Review of Systems: As per HPI otherwise 10 point review of systems negative.  Assessment/Plan  Principal Problem:    Cellulitis and abscess of hand Active Problems:   Intravenous drug abuse (HCC)   Cellulitis of right upper extremity   Cellulitis of left upper extremity   Bipolar 1 disorder (HCC)   Chronic hepatitis C without hepatic coma (HCC)   Cellulitis and abscess of the hand concerning for sepsis Cellulitis of the forearms bilaterally with multiple sites of induration concerning for abscess -Concerning for abscess of the bilateral arms -Status post I&D side of the left hand by ED provider -Broad-spectrum antibiotics has been started and we will continue -CT with contrast of bilateral hands, forearms, and humerus have been ordered to assess for abscess -Blood cultures x2 collected and pending -CBC in the a.m.  Sinus tachycardia-secondary to cellulitis, improving -Treat underlying etiology as above  IV drug abuse-counseling has been given  History of IV heroin use-Suboxone resumed  History of bipolar 1 disorder-resumed home Abilify 30 mg once daily, and Seroquel 300 mg by mouth daily  Anxiety/depression-resumed home bupropion 150 mg SR daily, fluoxetine 20 mg daily -Denies SI/HI  DVT prophylaxis: Enoxaparin 40 mg subcutaneous daily Code Status: Full code Diet: Regular diet Family Communication: No Disposition Plan: Pending CT imaging and clinical course Consults called: Not at this time Admission status: Observation with telemetry, pending CT imaging  Past Medical History:  Diagnosis Date  . Anxiety   . Depression   . Herpes   . Staph skin infection    Past Surgical History:  Procedure Laterality Date  . NO PAST SURGERIES     Social History:  reports that she has been smoking  cigarettes. She has been smoking about 1.00 pack per day. She has never used smokeless tobacco. She reports previous alcohol use. She reports current drug use. Drug: Methamphetamines.  No Known Allergies Family History  Problem Relation Age of Onset  . Healthy Mother   . Healthy Father    Family  history: Family history reviewed and not pertinent  Prior to Admission medications   Medication Sig Start Date End Date Taking? Authorizing Provider  ARIPiprazole (ABILIFY) 10 MG tablet Take 10 mg by mouth daily.  09/21/18   [provider]  ARIPiprazole (ABILIFY) 30 MG tablet Take 30 mg by mouth daily. 11/09/19   [provider]  atomoxetine (STRATTERA) 40 MG capsule TAKE 1 CAPSULE BY MOUTH DAILY FOR 3 DAYS, THEN TAKE 1 CAPSULE TWICE DAILY FOR 4 WEEKS 11/23/19   [provider]  Buprenorphine HCl-Naloxone HCl 8-2 MG FILM Place under the tongue.    [provider]  buPROPion (WELLBUTRIN SR) 150 MG 12 hr tablet Take by mouth.    [provider]  cephALEXin (KEFLEX) 500 MG capsule Take 1 capsule (500 mg total) by mouth 2 (two) times daily. 02/24/19   Tommie Sams, DO  divalproex (DEPAKOTE ER) 500 MG 24 hr tablet Take 500 mg by mouth in the morning and at bedtime. 10/12/19   [provider]  doxycycline (VIBRAMYCIN) 100 MG capsule Take by mouth. 04/02/19   [provider]  FLUoxetine (PROZAC) 20 MG capsule Take by mouth.    [provider]  lamoTRIgine (LAMICTAL) 200 MG tablet Take by mouth. 06/10/17   [provider]  QUEtiapine (SEROQUEL) 300 MG tablet Take by mouth. 06/10/17   [provider]   Physical Exam: Vitals:   11/30/19 1230 11/30/19 1300 11/30/19 1330 11/30/19 1641  BP: 129/84 (!) 126/91 130/90 (!) 147/99  Pulse: (!) 118 (!) 105 (!) 106 97  Resp: 14 20 16 18   Temp:    98.7 F (37.1 C)  TempSrc:    Oral  SpO2: 100% 100% 100% 100%  Weight:      Height:       Constitutional: appears age-appropriate, NAD, calm, comfortable Eyes: PERRL, lids and conjunctivae normal ENMT: Mucous membranes are moist. Posterior pharynx clear of any exudate or lesions. Age-appropriate dentition. Hearing appropriate Neck: normal, supple, no masses, no thyromegaly Respiratory: clear to auscultation bilaterally, no  wheezing, no crackles. Normal respiratory effort. No accessory muscle use.  Cardiovascular: Regular rate and rhythm, no murmurs / rubs / gallops. No extremity edema. 2+ pedal pulses. No carotid bruits.  Abdomen: no tenderness, no masses palpated, no hepatosplenomegaly. Bowel sounds positive.  Musculoskeletal: no clubbing / cyanosis. No joint deformity upper and lower extremities. Good ROM, no contractures, no atrophy. Normal muscle tone.  Skin: no rashes, lesions, ulcers. No induration Neurologic: CN 2-12 grossly intact. Sensation intact. Strength 5/5 in all 4.  Psychiatric: Normal judgment and insight. Alert and oriented x 3. Normal mood.   EKG: Independently reviewed, showing sinus tachycardia with rate of 139, QTc 449  Chest x-ray on Admission: Personally reviewed and I agree with radiologist reading as below.  DG Chest Port 1 View  Result Date: 11/30/2019 CLINICAL DATA:  Questionable sepsis EXAM: PORTABLE CHEST 1 VIEW COMPARISON:  01/19/2013 chest radiograph. FINDINGS: Stable cardiomediastinal silhouette with normal heart size. No pneumothorax. No pleural effusion. Lungs appear clear, with no acute consolidative airspace disease and no pulmonary edema. IMPRESSION: No active disease. Electronically Signed   By: 03/19/2013 M.D.   On:  11/30/2019 12:21   Labs on Admission: I have personally reviewed following labs  CBC: Recent Labs  Lab 11/30/19 1144  WBC 11.8*  NEUTROABS 8.7*  HGB 12.6  HCT 35.5*  MCV 83.1  PLT 221   Basic Metabolic Panel: Recent Labs  Lab 11/30/19 1144  NA 133*  K 3.2*  CL 99  CO2 23  GLUCOSE 123*  BUN 15  CREATININE 0.93  CALCIUM 9.2   GFR: Estimated Creatinine Clearance: 81 mL/min (by C-G formula based on SCr of 0.93 mg/dL). Liver Function Tests: Recent Labs  Lab 11/30/19 1144  AST 136*  ALT 70*  ALKPHOS 68  BILITOT 1.3*  PROT 7.7  ALBUMIN 4.2   Coagulation Profile: Recent Labs  Lab 11/30/19 1144  INR 1.1   Urine analysis:      Component Value Date/Time   COLORURINE YELLOW (A) 11/30/2019 1115   APPEARANCEUR HAZY (A) 11/30/2019 1115   APPEARANCEUR CLEAR 09/05/2012 1810   LABSPEC 1.021 11/30/2019 1115   LABSPEC >=1.030 09/05/2012 1810   PHURINE 5.0 11/30/2019 1115   GLUCOSEU NEGATIVE 11/30/2019 1115   GLUCOSEU NEGATIVE 09/05/2012 1810   HGBUR LARGE (A) 11/30/2019 1115   BILIRUBINUR NEGATIVE 11/30/2019 1115   BILIRUBINUR NEGATIVE 09/05/2012 1810   KETONESUR NEGATIVE 11/30/2019 1115   PROTEINUR NEGATIVE 11/30/2019 1115   NITRITE NEGATIVE 11/30/2019 1115   LEUKOCYTESUR NEGATIVE 11/30/2019 1115   LEUKOCYTESUR NEGATIVE 09/05/2012 1810   Lashanta Elbe N Sarahy Creedon D.O. [facility]  If 12AM-7AM, please contact overnight-coverage provider If 7AM-7PM, please contact day coverage provider [email or web address]  11/30/2019, 8:05 PM

## 2019-11-30 NOTE — Consult Note (Signed)
Pharmacy Antibiotic Note  Tina Barrera is a 29 y.o. female admitted on 11/30/2019 with right hand cellulitis in the setting of IV drug use.  Pharmacy has been consulted for vancomycin and cefepime dosing. She received 2 grams IV ceftriaxone and 1500 mg IV vancomycin in the ED  Plan:  1) start cefepime 2 grams IV every 8 hours  2) start vancomycin 500 mg IV Q 8 hrs  Ke: 0.089 h-1, T1/2: 7.8 h  Css (calculated): 21.1 / 10.8 mcg/mL  Assess renal function daily while on vancomycin  Levels as clinically indicated  Height: 5\' 5"  (165.1 cm) Weight: 63.5 kg (140 lb) IBW/kg (Calculated) : 57  Temp (24hrs), Avg:99 F (37.2 C), Min:98.7 F (37.1 C), Max:99.2 F (37.3 C)  Recent Labs  Lab 11/30/19 1144 11/30/19 1515  WBC 11.8*  --   CREATININE 0.93  --   LATICACIDVEN 1.2 1.5    Estimated Creatinine Clearance: 81 mL/min (by C-G formula based on SCr of 0.93 mg/dL).    No Known Allergies  Antimicrobials this admission: ceftriaxone x 1 11/19 cefepime >>  11/19 vancomycin >>   Microbiology results: 11/19 BCx: pending 11/19 UCx: pending 11/19 SARS CoV-2: negative 11/19 influenza A/B: negative   Thank you for allowing pharmacy to be a part of this patient's care.  12/19 11/30/2019 7:49 PM

## 2019-11-30 NOTE — ED Provider Notes (Signed)
[facility] Emergency Department Provider Note    None    (approximate)  I have reviewed the triage vital signs and the nursing notes.   HISTORY  Chief Complaint Nasal Congestion and Tachycardia    HPI Tina Barrera is a 29 y.o. female with the below listed past medical history and history of IV drug abuse presents to the ER for several days of generalized malaise cough congestion as well as some pain in her bilateral upper extremities where she has injected meth.  Went to [place] [facility] to be checked out.  Was found to be tachycardic with a low-grade temperature was sent to the ER.    Past Medical History:  Diagnosis Date  . Anxiety   . Depression   . Herpes   . Staph skin infection    Family History  Problem Relation Age of Onset  . Healthy Mother   . Healthy Father    Past Surgical History:  Procedure Laterality Date  . NO PAST SURGERIES     There are no problems to display for this patient.     Prior to Admission medications   Medication Sig Start Date End Date Taking? Authorizing Provider  ARIPiprazole (ABILIFY) 10 MG tablet Take 10 mg by mouth daily.  09/21/18   [provider]  ARIPiprazole (ABILIFY) 30 MG tablet Take 30 mg by mouth daily. 11/09/19   [provider]  atomoxetine (STRATTERA) 40 MG capsule TAKE 1 CAPSULE BY MOUTH DAILY FOR 3 DAYS, THEN TAKE 1 CAPSULE TWICE DAILY FOR 4 WEEKS 11/23/19   [provider]  Buprenorphine HCl-Naloxone HCl 8-2 MG FILM Place under the tongue.    [provider]  buPROPion (WELLBUTRIN SR) 150 MG 12 hr tablet Take by mouth.    [provider]  cephALEXin (KEFLEX) 500 MG capsule Take 1 capsule (500 mg total) by mouth 2 (two) times daily. 02/24/19   Tommie Sams, DO  divalproex (DEPAKOTE ER) 500 MG 24 hr tablet Take 500 mg by mouth in the morning and at bedtime. 10/12/19   [provider]  doxycycline (VIBRAMYCIN) 100 MG capsule Take by  mouth. 04/02/19   [provider]  FLUoxetine (PROZAC) 20 MG capsule Take by mouth.    [provider]  lamoTRIgine (LAMICTAL) 200 MG tablet Take by mouth. 06/10/17   [provider]  QUEtiapine (SEROQUEL) 300 MG tablet Take by mouth. 06/10/17   [provider]    Allergies Patient has no known allergies.    Social History Social History   Tobacco Use  . Smoking status: Current Every Day Smoker    Packs/day: 1.00    Types: Cigarettes  . Smokeless tobacco: Never Used  Vaping Use  . Vaping Use: Some days  Substance Use Topics  . Alcohol use: Not Currently  . Drug use: Yes    Types: Methamphetamines    Comment: A few days ago    Review of Systems Patient denies headaches, rhinorrhea, blurry vision, numbness, shortness of breath, chest pain, edema, cough, abdominal pain, nausea, vomiting, diarrhea, dysuria, fevers, rashes or hallucinations unless otherwise stated above in HPI. ____________________________________________   PHYSICAL EXAM:  VITAL SIGNS: Vitals:   11/30/19 1300 11/30/19 1330  BP: (!) 126/91 130/90  Pulse: (!) 105 (!) 106  Resp: 20 16  Temp:    SpO2: 100% 100%    Constitutional: Alert and oriented.  Eyes: Conjunctivae are normal.  Head: Atraumatic. Nose: No congestion/rhinnorhea. Mouth/Throat: Mucous membranes are moist.  Neck: No stridor. Painless ROM.  Cardiovascular: Normal rate, regular rhythm. Grossly normal heart sounds.  Good peripheral circulation. Respiratory: Normal respiratory effort.  No retractions. Lungs CTAB. Gastrointestinal: Soft and nontender. No distention. No abdominal bruits. No CVA tenderness. Genitourinary:  Musculoskeletal: Area of streaky cellulitis left forearm.  There is also an area on the dorsal aspect of her second MCP with some fluctuance corresponding to recent site of injection.  Also has some areas cellulitis in the right forearm as well.  No signs for flexor teno,  No crepitus,  Well  perfused distally  No lower extremity tenderness nor edema.  No joint effusions. Neurologic:  Normal speech and language. No gross focal neurologic deficits are appreciated. No facial droop Skin:  Skin is warm, dry and intact. No rash noted. Psychiatric: Mood and affect are normal. Speech and behavior are normal.  ____________________________________________   LABS (all labs ordered are listed, but only abnormal results are displayed)  Results for orders placed or performed during the hospital encounter of 11/30/19 (from the past 24 hour(s))  Lactic acid, plasma     Status: None   Collection Time: 11/30/19 11:44 AM  Result Value Ref Range   Lactic Acid, Venous 1.2 0.5 - 1.9 mmol/L  Comprehensive metabolic panel     Status: Abnormal   Collection Time: 11/30/19 11:44 AM  Result Value Ref Range   Sodium 133 (L) 135 - 145 mmol/L   Potassium 3.2 (L) 3.5 - 5.1 mmol/L   Chloride 99 98 - 111 mmol/L   CO2 23 22 - 32 mmol/L   Glucose, Bld 123 (H) 70 - 99 mg/dL   BUN 15 6 - 20 mg/dL   Creatinine, Ser 1.610.93 0.44 - 1.00 mg/dL   Calcium 9.2 8.9 - 09.610.3 mg/dL   Total Protein 7.7 6.5 - 8.1 g/dL   Albumin 4.2 3.5 - 5.0 g/dL   AST 045136 (H) 15 - 41 U/L   ALT 70 (H) 0 - 44 U/L   Alkaline Phosphatase 68 38 - 126 U/L   Total Bilirubin 1.3 (H) 0.3 - 1.2 mg/dL   GFR, Estimated >40>60 >98>60 mL/min   Anion gap 11 5 - 15  CBC WITH DIFFERENTIAL     Status: Abnormal   Collection Time: 11/30/19 11:44 AM  Result Value Ref Range   WBC 11.8 (H) 4.0 - 10.5 K/uL   RBC 4.27 3.87 - 5.11 MIL/uL   Hemoglobin 12.6 12.0 - 15.0 g/dL   HCT 11.935.5 (L) 36 - 46 %   MCV 83.1 80.0 - 100.0 fL   MCH 29.5 26.0 - 34.0 pg   MCHC 35.5 30.0 - 36.0 g/dL   RDW 14.712.0 82.911.5 - 56.215.5 %   Platelets 221 150 - 400 K/uL   nRBC 0.0 0.0 - 0.2 %   Neutrophils Relative % 73 %   Neutro Abs 8.7 (H) 1.7 - 7.7 K/uL   Lymphocytes Relative 20 %   Lymphs Abs 2.3 0.7 - 4.0 K/uL   Monocytes Relative 5 %   Monocytes Absolute 0.6 0.1 - 1.0 K/uL    Eosinophils Relative 1 %   Eosinophils Absolute 0.1 0.0 - 0.5 K/uL   Basophils Relative 0 %   Basophils Absolute 0.0 0.0 - 0.1 K/uL   Immature Granulocytes 1 %   Abs Immature Granulocytes 0.07 0.00 - 0.07 K/uL  Protime-INR     Status: None   Collection Time: 11/30/19 11:44 AM  Result Value Ref Range   Prothrombin Time 13.5 11.4 - 15.2 seconds  INR 1.1 0.8 - 1.2  APTT     Status: None   Collection Time: 11/30/19 11:44 AM  Result Value Ref Range   aPTT 28 24 - 36 seconds  Resp Panel by RT-PCR (Flu A&B, Covid) Nasopharyngeal Swab     Status: None   Collection Time: 11/30/19 12:30 PM   Specimen: Nasopharyngeal Swab; Nasopharyngeal(NP) swabs in vial transport medium  Result Value Ref Range   SARS Coronavirus 2 by RT PCR NEGATIVE NEGATIVE   Influenza A by PCR NEGATIVE NEGATIVE   Influenza B by PCR NEGATIVE NEGATIVE   ____________________________________________  EKG My review and personal interpretation at Time: 11:11   Indication: tachycardia  Rate: 140  Rhythm: sinus Axis: normal Other: normal intervals, non specific st changes likely rate dependent ____________________________________________  RADIOLOGY  I personally reviewed all radiographic images ordered to evaluate for the above acute complaints and reviewed radiology reports and findings.  These findings were personally discussed with the patient.  Please see medical record for radiology report.  ____________________________________________   PROCEDURES  Procedure(s) performed:  Marland KitchenMarland KitchenIncision and Drainage  Date/Time: 11/30/2019 2:11 PM Performed by: Willy Eddy, MD Authorized by: Willy Eddy, MD   Consent:    Consent obtained:  Verbal   Consent given by:  Patient   Risks discussed:  Bleeding, infection, incomplete drainage and pain   Alternatives discussed:  Alternative treatment, delayed treatment and observation Location:    Type:  Abscess   Size:  0.5cm   Location:  Upper extremity   Upper  extremity location:  Hand   Hand location:  L hand Pre-procedure details:    Skin preparation:  Antiseptic wash Anesthesia (see MAR for exact dosages):    Anesthesia method:  Topical application   Topical anesthetic:  EMLA cream Procedure type:    Complexity:  Simple Procedure details:    Incision types:  Stab incision   Incision depth:  Dermal   Scalpel blade:  11   Drainage:  Serous   Drainage amount:  Scant   Wound treatment:  Wound left open   Packing materials:  None Post-procedure details:    Patient tolerance of procedure:  Tolerated well, no immediate complications      Critical Care performed: no ____________________________________________   INITIAL IMPRESSION / ASSESSMENT AND PLAN / ED COURSE  Pertinent labs & imaging results that were available during my care of the patient were reviewed by me and considered in my medical decision making (see chart for details).   DDX: cellulitis, abscess, nec fasc, bacteremia, pneumonia, cystitis, [place]   Trenese Haft is a 29 y.o. who presents to the ED with presentation as described above.  Patient markedly tachycardic low-grade temperature concerning for sepsis with cellulitic changes to bilateral upper extremities likely secondary to recent meth injection.  She is protecting her airway will give IV fluids we will send blood work to evaluate for sepsis and the above differential.  Abdominal exam otherwise soft benign.  Clinical Course as of Nov 30 1410  Fri Nov 30, 2019  1410 Patient's work-up is fairly reassuring given how tachycardic she was upon arrival.  Is receiving IV antibiotics.  I did incise that area of fluctuance the dorsal aspect of the base of the left second digit with some serous drainage.  It was left open.  Patient tolerated well.  Discussed the case with hospitalist for admission for IV antibiotics.  Patient agreeable to plan.   [PR]    Clinical Course User Index [PR] Willy Eddy, MD  The  patient was evaluated in Emergency Department today for the symptoms described in the history of present illness. He/she was evaluated in the context of the global COVID-19 pandemic, which necessitated consideration that the patient might be at risk for infection with the SARS-CoV-2 virus that causes COVID-19. Institutional protocols and algorithms that pertain to the evaluation of patients at risk for COVID-19 are in a state of rapid change based on information released by regulatory bodies including the CDC and federal and state organizations. These policies and algorithms were followed during the patient's care in the ED.  As part of my medical decision making, I reviewed the following data within the electronic MEDICAL RECORD NUMBER Nursing notes reviewed and incorporated, Labs reviewed, notes from prior ED visits and [place] Controlled Substance Database   ____________________________________________   FINAL CLINICAL IMPRESSION(S) / ED DIAGNOSES  Final diagnoses:  Cellulitis of left upper extremity      NEW MEDICATIONS STARTED DURING THIS VISIT:  New Prescriptions   No medications on file     Note:  This document was prepared using Dragon voice recognition software and may include unintentional dictation errors.    Willy Eddy, MD 11/30/19 [phone]

## 2019-11-30 NOTE — ED Notes (Signed)
Pt presents to ED with c/o "racing heart." Pt states she has has a sore throat and congestion for the last couple of weeks. Pt also has swelling and redness to billateral hands due to meth injection. Pt states she last injected last night around 2200. Pt also has c/o back pain. Pt ambulatory on arrival and placed on monitor. Awaiting further orders. Will continue to monitor.

## 2019-11-30 NOTE — ED Notes (Signed)
Unable to obtain second set of cultures due to unable to start second IV at this time. MD aware.

## 2019-11-30 NOTE — ED Notes (Signed)
IV team consult placed and Roxan Hockey, MD notified of 2 RNs unable to start IV with verbal read bac. Pt on monitor. Will continue to monitor.

## 2019-11-30 NOTE — ED Triage Notes (Signed)
First RN Note: pt to ED ambulatory from [facility] with c/o rapid HR, unknown what HR was, pt states she does not know what her HR was. Pt A&O x4, ambulatory to ED from [facility] without difficulty.

## 2019-11-30 NOTE — ED Notes (Signed)
RN having trouble obtaining IV access at this time.

## 2019-11-30 NOTE — Progress Notes (Signed)
PHARMACY -  BRIEF ANTIBIOTIC NOTE   Pharmacy has received consult(s) for Vancomycin from an ED provider for cellulitis.  The patient's profile has been reviewed for ht/wt/allergies/indication/available labs.    One time order(s) placed for Vancomycin 1500 mg.  Wt 63.5kg, SCr 0.93, CrCl 81  Further antibiotics/pharmacy consults should be ordered by admitting physician if indicated.                       Thank you,  Otelia Sergeant, PharmD, [facility] 11/30/2019 12:43 PM

## 2019-12-01 DIAGNOSIS — F419 Anxiety disorder, unspecified: Secondary | ICD-10-CM | POA: Diagnosis present

## 2019-12-01 DIAGNOSIS — Z20822 Contact with and (suspected) exposure to covid-19: Secondary | ICD-10-CM | POA: Diagnosis present

## 2019-12-01 DIAGNOSIS — L03114 Cellulitis of left upper limb: Secondary | ICD-10-CM | POA: Diagnosis present

## 2019-12-01 DIAGNOSIS — F1590 Other stimulant use, unspecified, uncomplicated: Secondary | ICD-10-CM | POA: Diagnosis present

## 2019-12-01 DIAGNOSIS — F17290 Nicotine dependence, other tobacco product, uncomplicated: Secondary | ICD-10-CM | POA: Diagnosis present

## 2019-12-01 DIAGNOSIS — L02512 Cutaneous abscess of left hand: Secondary | ICD-10-CM | POA: Diagnosis present

## 2019-12-01 DIAGNOSIS — B182 Chronic viral hepatitis C: Secondary | ICD-10-CM | POA: Diagnosis present

## 2019-12-01 DIAGNOSIS — A419 Sepsis, unspecified organism: Secondary | ICD-10-CM | POA: Diagnosis present

## 2019-12-01 DIAGNOSIS — L03119 Cellulitis of unspecified part of limb: Secondary | ICD-10-CM | POA: Diagnosis present

## 2019-12-01 DIAGNOSIS — F1190 Opioid use, unspecified, uncomplicated: Secondary | ICD-10-CM | POA: Diagnosis present

## 2019-12-01 DIAGNOSIS — F319 Bipolar disorder, unspecified: Secondary | ICD-10-CM | POA: Diagnosis present

## 2019-12-01 DIAGNOSIS — Z79899 Other long term (current) drug therapy: Secondary | ICD-10-CM | POA: Diagnosis not present

## 2019-12-01 DIAGNOSIS — F17210 Nicotine dependence, cigarettes, uncomplicated: Secondary | ICD-10-CM | POA: Diagnosis present

## 2019-12-01 DIAGNOSIS — L02519 Cutaneous abscess of unspecified hand: Secondary | ICD-10-CM | POA: Diagnosis not present

## 2019-12-01 LAB — CBC
HCT: 32.2 % — ABNORMAL LOW (ref 36.0–46.0)
Hemoglobin: 11.2 g/dL — ABNORMAL LOW (ref 12.0–15.0)
MCH: 29.1 pg (ref 26.0–34.0)
MCHC: 34.8 g/dL (ref 30.0–36.0)
MCV: 83.6 fL (ref 80.0–100.0)
Platelets: 169 10*3/uL (ref 150–400)
RBC: 3.85 MIL/uL — ABNORMAL LOW (ref 3.87–5.11)
RDW: 12.1 % (ref 11.5–15.5)
WBC: 6.8 10*3/uL (ref 4.0–10.5)
nRBC: 0 % (ref 0.0–0.2)

## 2019-12-01 LAB — BASIC METABOLIC PANEL
Anion gap: 10 (ref 5–15)
BUN: 11 mg/dL (ref 6–20)
CO2: 20 mmol/L — ABNORMAL LOW (ref 22–32)
Calcium: 8.7 mg/dL — ABNORMAL LOW (ref 8.9–10.3)
Chloride: 105 mmol/L (ref 98–111)
Creatinine, Ser: 0.78 mg/dL (ref 0.44–1.00)
GFR, Estimated: >60 mL/min (ref >60)
Glucose, Bld: 89 mg/dL (ref 70–99)
Potassium: 3.4 mmol/L — ABNORMAL LOW (ref 3.5–5.1)
Sodium: 135 mmol/L (ref 135–145)

## 2019-12-01 LAB — HIV ANTIBODY (ROUTINE TESTING W REFLEX): HIV Screen 4th Generation wRfx: NONREACTIVE

## 2019-12-01 MED ORDER — ACETAMINOPHEN 325 MG PO TABS
650.0000 mg | ORAL_TABLET | Freq: Four times a day (QID) | ORAL | Status: DC | PRN
  Administered 2019-12-02: 650 mg via ORAL
  Filled 2019-12-01: qty 2

## 2019-12-01 MED ORDER — ALUM & MAG HYDROXIDE-SIMETH 200-200-20 MG/5ML PO SUSP: 30.0000 mL | ORAL | Status: DC | PRN

## 2019-12-01 MED ORDER — KETOROLAC TROMETHAMINE 15 MG/ML IJ SOLN
15.0000 mg | Freq: Four times a day (QID) | INTRAMUSCULAR | Status: DC
  Administered 2019-12-01 – 2019-12-02 (×4): 15 mg via INTRAVENOUS
  Filled 2019-12-01 (×4): qty 1

## 2019-12-01 MED ORDER — VANCOMYCIN HCL IN DEXTROSE 1-5 GM/200ML-% IV SOLN
1000.0000 mg | Freq: Two times a day (BID) | INTRAVENOUS | Status: DC
  Administered 2019-12-01 – 2019-12-02 (×2): 1000 mg via INTRAVENOUS
  Filled 2019-12-01 (×4): qty 200

## 2019-12-01 NOTE — Progress Notes (Signed)
PROGRESS NOTE    Tina Barrera  GEX:[id number] DOB: [birth year] DOA: 11/30/2019 PCP: Gladstone Lighter, MD    Brief Narrative:  29 y.o. female with medical history significant for IV drug user, anxiety, bipolar disorder, depression presented to the emergency department for chief concerns of rapid heart rate.  She presented to [facility] for chief concerns of sinus infection and skin infection.  At the clinic, it was noted that she had a rapid heart rate and they sent her to the emergency department.  She states she injected in multiple places and then it got red immediately. She reports that she did not inject anymore before 11/29/19 and that she has been clean since March 2021.  She reports that she started injecting meth on 11/29/2019.  She states that she was a prior IV heroin user and that she has not used IV heroin in 5 years because she is now on Suboxone.  Patient was started on broad-spectrum antibiotics and intravenous fluids. Remained hemodynamically stable overnight. Exam slowly improving. Grip strength on the left is improving. Extensive CT imaging survey demonstrates cellulitis in multiple extremities but no evidence of abscess or deep tissue infection.   Assessment & Plan:   Principal Problem:   Cellulitis and abscess of hand Active Problems:   Intravenous drug abuse ([place])   Cellulitis of right upper extremity   Cellulitis of left upper extremity   Bipolar 1 disorder (HCC)   Chronic hepatitis C without hepatic coma (HCC)  Cellulitis of left upper extremity Sepsis secondary to above, ruled in Sepsis criteria met with source of infection, tachycardia, leukocytosis with neutrophilic predominance No endorgan damage to suggest severe sepsis No evidence of septic shock CT imaging survey negative for abscess Plan: Continue broad-spectrum antibiotics. Vancomycin and cefepime. Can likely de-escalate within 24 hours Follow blood cultures Continue intravenous  fluids Pain control as needed. Avoid narcotics Possible discharge in 24 hours if exam continues to improve and all cultures remain negative  Sinus tachycardia Secondary to infection, improving No chest pain Can DC telemetry  Intravenous drug abuse Provide counseling TOC consult  History of intravenous heroin use Continue Suboxone per home dose  Bipolar 1 disorder Aripiprazole 30 mg daily Seroquel 300 mg daily  Anxiety/depression Wellbutrin 150 mg SR daily Prozac 20 mg p.o. daily No evidence of SI/HI   DVT prophylaxis: Lovenox Code Status: Full Family Communication: None today Disposition Plan: Status is: Observation  The patient will require care spanning > 2 midnights and should be moved to inpatient because: IV treatments appropriate due to intensity of illness or inability to take PO and Inpatient level of care appropriate due to severity of illness  Dispo: The patient is from: Home              Anticipated d/c is to: Home              Anticipated d/c date is: 1 day              Patient currently is not medically stable to d/c.  Resolving sepsis physiology in the setting of cellulitis secondary to intravenous drug use. Remains on broad-spectrum antibiotics. Exam improving over interval. Possible discharge home 12/02/2019 if exam continues to improve and all labs reassuring.  Consultants:   None  Procedures:   None  Antimicrobials:   Vancomycin  Cefepime   Subjective: Patient seen and examined. Reports pain in the left upper extremity though improving over interval. Able to make a fist. Good grip strength.  Objective:  Vitals:   11/30/19 2357 12/01/19 0401 12/01/19 0807 12/01/19 1156  BP: 113/68 113/68 105/66 110/67  Pulse: 79 (!) 102 86 87  Resp: _0 Temp: 98.1 F (36.7 C) 97.9 F (36.6 C) 98.1 F (36.7 C) 97.9 F (36.6 C)  TempSrc:    Oral  SpO2: 100% 99% 97%   Weight:      Height:        Intake/Output Summary (Last 24 hours) at  12/01/2019 1228 Last data filed at 12/01/2019 0751 Gross per 24 hour  Intake 1583.33 ml  Output --  Net 1583.33 ml   Filed Weights   11/30/19 1058  Weight: 63.5 kg    Examination:  General exam: Appears calm and comfortable  Respiratory system: Clear to auscultation. Respiratory effort normal. Cardiovascular system: S1 & S2 heard, RRR. No JVD, murmurs, rubs, gallops or clicks. No pedal edema. Gastrointestinal system: Abdomen is nondistended, soft and nontender. No organomegaly or masses felt. Normal bowel sounds heard. Central nervous system: Alert and oriented. No focal neurological deficits. Extremities: Decreased power left upper extremity. Hand swollen. Decreased grip strength Skin: Multiple areas of excoriation on bilateral upper extremities. Multiple piercings on face Psychiatry: Judgement and insight appear normal. Mood & affect appropriate.     Data Reviewed: I have personally reviewed following labs and imaging studies  CBC: Recent Labs  Lab 11/30/19 1144 12/01/19 0653  WBC 11.8* 6.8  NEUTROABS 8.7*  --   HGB 12.6 11.2*  HCT 35.5* 32.2*  MCV 83.1 83.6  PLT 221 381   Basic Metabolic Panel: Recent Labs  Lab 11/30/19 1144 12/01/19 0653  NA 133* 135  K 3.2* 3.4*  CL 99 105  CO2 23 20*  GLUCOSE 123* 89  BUN 15 11  CREATININE 0.93 0.78  CALCIUM 9.2 8.7*   GFR: Estimated Creatinine Clearance: 94.2 mL/min (by C-G formula based on SCr of 0.78 mg/dL). Liver Function Tests: Recent Labs  Lab 11/30/19 1144  AST 136*  ALT 70*  ALKPHOS 68  BILITOT 1.3*  PROT 7.7  ALBUMIN 4.2   No results for input(s): LIPASE, AMYLASE in the last 168 hours. No results for input(s): AMMONIA in the last 168 hours. Coagulation Profile: Recent Labs  Lab 11/30/19 1144  INR 1.1   Cardiac Enzymes: No results for input(s): CKTOTAL, CKMB, CKMBINDEX, TROPONINI in the last 168 hours. BNP (last 3 results) No results for input(s): PROBNP in the last 8760 hours. HbA1C: No  results for input(s): HGBA1C in the last 72 hours. CBG: No results for input(s): GLUCAP in the last 168 hours. Lipid Profile: No results for input(s): CHOL, HDL, LDLCALC, TRIG, CHOLHDL, LDLDIRECT in the last 72 hours. Thyroid Function Tests: No results for input(s): TSH, T4TOTAL, FREET4, T3FREE, THYROIDAB in the last 72 hours. Anemia Panel: No results for input(s): VITAMINB12, FOLATE, FERRITIN, TIBC, IRON, RETICCTPCT in the last 72 hours. Sepsis Labs: Recent Labs  Lab 11/30/19 1144 11/30/19 1515  LATICACIDVEN 1.2 1.5    Recent Results (from the past 240 hour(s))  Blood culture (routine single)     Status: None (Preliminary result)   Collection Time: 11/30/19 11:46 AM   Specimen: BLOOD  Result Value Ref Range Status   Specimen Description BLOOD BLOOD LEFT FOREARM  Final   Special Requests   Final    BOTTLES DRAWN AEROBIC AND ANAEROBIC Blood Culture adequate volume   Culture   Final    NO GROWTH < 24 HOURS Performed at [facility], [address],  [place], [place] [address]    Report Status PENDING  Incomplete  Resp Panel by RT-PCR (Flu A&B, Covid) Nasopharyngeal Swab     Status: None   Collection Time: 11/30/19 12:30 PM   Specimen: Nasopharyngeal Swab; Nasopharyngeal(NP) swabs in vial transport medium  Result Value Ref Range Status   SARS Coronavirus 2 by RT PCR NEGATIVE NEGATIVE Final    Comment: (NOTE) SARS-CoV-2 target nucleic acids are NOT DETECTED.  The SARS-CoV-2 RNA is generally detectable in upper respiratory specimens during the acute phase of infection. The lowest concentration of SARS-CoV-2 viral copies this assay can detect is 138 copies/mL. A negative result does not preclude SARS-Cov-2 infection and should not be used as the sole basis for treatment or other patient management decisions. A negative result may occur with  improper specimen collection/handling, submission of specimen other than nasopharyngeal swab, presence of viral mutation(s)  within the areas targeted by this assay, and inadequate number of viral copies(<138 copies/mL). A negative result must be combined with clinical observations, patient history, and epidemiological information. The expected result is Negative.  Fact Sheet for Patients:  EntrepreneurPulse.com.au  Fact Sheet for Healthcare Providers:  IncredibleEmployment.be  This test is no t yet approved or cleared by the [place] FDA and  has been authorized for detection and/or diagnosis of SARS-CoV-2 by FDA under an Emergency Use Authorization (EUA). This EUA will remain  in effect (meaning this test can be used) for the duration of the COVID-19 declaration under Section 564(b)(1) of the Act, 21 U.S.C.section 360bbb-3(b)(1), unless the authorization is terminated  or revoked sooner.       Influenza A by PCR NEGATIVE NEGATIVE Final   Influenza B by PCR NEGATIVE NEGATIVE Final    Comment: (NOTE) The Xpert Xpress SARS-CoV-2/FLU/RSV plus assay is intended as an aid in the diagnosis of influenza from Nasopharyngeal swab specimens and should not be used as a sole basis for treatment. Nasal washings and aspirates are unacceptable for Xpert Xpress SARS-CoV-2/FLU/RSV testing.  Fact Sheet for Patients: EntrepreneurPulse.com.au  Fact Sheet for Healthcare Providers: IncredibleEmployment.be  This test is not yet approved or cleared by the [place] FDA and has been authorized for detection and/or diagnosis of SARS-CoV-2 by FDA under an Emergency Use Authorization (EUA). This EUA will remain in effect (meaning this test can be used) for the duration of the COVID-19 declaration under Section 564(b)(1) of the Act, 21 U.S.C. section 360bbb-3(b)(1), unless the authorization is terminated or revoked.  Performed at [facility], [address] [place], [place] [address]   Culture, blood (single)     Status: None  (Preliminary result)   Collection Time: 11/30/19  3:15 PM   Specimen: BLOOD  Result Value Ref Range Status   Specimen Description BLOOD BLOOD RIGHT HAND  Final   Special Requests   Final    BOTTLES DRAWN AEROBIC ONLY Blood Culture adequate volume   Culture   Final    NO GROWTH < 24 HOURS Performed at [facility], [address] [place], [place] [address]    Report Status PENDING  Incomplete         Radiology Studies: CT HUMERUS LEFT W CONTRAST  Result Date: 11/30/2019 CLINICAL DATA:  29 year old female with IV drug use. Concern for osteomyelitis an abscess. EXAM: CT OF THE UPPER LEFT EXTREMITY WITH CONTRAST TECHNIQUE: Multidetector CT imaging of the left upper extremity was performed according to the standard protocol following intravenous contrast administration. CONTRAST:  113m OMNIPAQUE IOHEXOL 300 MG/ML  SOLN COMPARISON:  None FINDINGS: Bones/Joint/Cartilage There is no acute fracture or dislocation. No bony erosion or evidence of periosteal reaction. Ligaments Suboptimally assessed by CT. Muscles and Tendons No intramuscular fluid collection or hematoma. Soft tissues There is diffuse subcutaneous edema of the forearm and hand which may represent cellulitis. No drainable fluid collection or abscess. IMPRESSION: 1. No acute fracture or dislocation. 2. Diffuse subcutaneous edema of the forearm and hand may represent cellulitis. No drainable fluid collection or abscess. Electronically Signed   By: Anner Crete M.D.   On: 11/30/2019 23:47   CT HUMERUS RIGHT W CONTRAST  Result Date: 11/30/2019 CLINICAL DATA:  Cellulitis IV drug user EXAM: CT OF THE UPPER RIGHT EXTREMITY WITH CONTRAST TECHNIQUE: Multidetector CT imaging of the upper right extremity was performed according to the standard protocol following intravenous contrast administration. CONTRAST:  119m OMNIPAQUE IOHEXOL 300 MG/ML  SOLN COMPARISON:  None. FINDINGS: Bones/Joint/Cartilage No fracture or dislocation. No  areas of cortical destruction or periosteal reaction are noted. No large joint effusions are seen. The articular surfaces are maintained. Ligaments Suboptimally assessed by CT. Muscles and Tendons The muscles surrounding the right upper extremity are intact without focal atrophy or tear. The visualized portion of the tendons are intact. Soft tissues Subcutaneous edema seen along the posterior aspect of the distal forearm and the hand. There is skin thickening and areas of the superficial induration. No loculated fluid collections or subcutaneous emphysema however are noted. IMPRESSION: Findings consistent of cellulitis involving the posterior aspect of the distal forearm and hand. No definite evidence of soft tissue abscess or osteomyelitis. Electronically Signed   By: BPrudencio PairM.D.   On: 11/30/2019 23:52   CT FOREARM LEFT W CONTRAST  Result Date: 11/30/2019 CLINICAL DATA:  29year old female with IV drug use. Concern for osteomyelitis an abscess. EXAM: CT OF THE UPPER LEFT EXTREMITY WITH CONTRAST TECHNIQUE: Multidetector CT imaging of the left upper extremity was performed according to the standard protocol following intravenous contrast administration. CONTRAST:  1064mOMNIPAQUE IOHEXOL 300 MG/ML  SOLN COMPARISON:  None FINDINGS: Bones/Joint/Cartilage There is no acute fracture or dislocation. No bony erosion or evidence of periosteal reaction. Ligaments Suboptimally assessed by CT. Muscles and Tendons No intramuscular fluid collection or hematoma. Soft tissues There is diffuse subcutaneous edema of the forearm and hand which may represent cellulitis. No drainable fluid collection or abscess. IMPRESSION: 1. No acute fracture or dislocation. 2. Diffuse subcutaneous edema of the forearm and hand may represent cellulitis. No drainable fluid collection or abscess. Electronically Signed   By: ArAnner Crete.D.   On: 11/30/2019 23:47   CT FOREARM RIGHT W CONTRAST  Result Date: 11/30/2019 CLINICAL DATA:   Cellulitis IV drug user EXAM: CT OF THE UPPER RIGHT EXTREMITY WITH CONTRAST TECHNIQUE: Multidetector CT imaging of the upper right extremity was performed according to the standard protocol following intravenous contrast administration. CONTRAST:  10081mMNIPAQUE IOHEXOL 300 MG/ML  SOLN COMPARISON:  None. FINDINGS: Bones/Joint/Cartilage No fracture or dislocation. No areas of cortical destruction or periosteal reaction are noted. No large joint effusions are seen. The articular surfaces are maintained. Ligaments Suboptimally assessed by CT. Muscles and Tendons The muscles surrounding the right upper extremity are intact without focal atrophy or tear. The visualized portion of the tendons are intact. Soft tissues Subcutaneous edema seen along the posterior aspect of the distal forearm and the hand. There is skin thickening and areas of the superficial induration. No loculated fluid collections or subcutaneous emphysema however are noted. IMPRESSION: Findings consistent of cellulitis  involving the posterior aspect of the distal forearm and hand. No definite evidence of soft tissue abscess or osteomyelitis. Electronically Signed   By: Prudencio Pair M.D.   On: 11/30/2019 23:52   CT HAND RIGHT W CONTRAST  Result Date: 11/30/2019 CLINICAL DATA:  Cellulitis IV drug user EXAM: CT OF THE UPPER RIGHT EXTREMITY WITH CONTRAST TECHNIQUE: Multidetector CT imaging of the upper right extremity was performed according to the standard protocol following intravenous contrast administration. CONTRAST:  143m OMNIPAQUE IOHEXOL 300 MG/ML  SOLN COMPARISON:  None. FINDINGS: Bones/Joint/Cartilage No fracture or dislocation. No areas of cortical destruction or periosteal reaction are noted. No large joint effusions are seen. The articular surfaces are maintained. Ligaments Suboptimally assessed by CT. Muscles and Tendons The muscles surrounding the right upper extremity are intact without focal atrophy or tear. The visualized portion of  the tendons are intact. Soft tissues Subcutaneous edema seen along the posterior aspect of the distal forearm and the hand. There is skin thickening and areas of the superficial induration. No loculated fluid collections or subcutaneous emphysema however are noted. IMPRESSION: Findings consistent of cellulitis involving the posterior aspect of the distal forearm and hand. No definite evidence of soft tissue abscess or osteomyelitis. Electronically Signed   By: BPrudencio PairM.D.   On: 11/30/2019 23:52   CT HAND LEFT W CONTRAST  Result Date: 11/30/2019 CLINICAL DATA:  29year old female with IV drug use. Concern for osteomyelitis an abscess. EXAM: CT OF THE UPPER LEFT EXTREMITY WITH CONTRAST TECHNIQUE: Multidetector CT imaging of the left upper extremity was performed according to the standard protocol following intravenous contrast administration. CONTRAST:  1036mOMNIPAQUE IOHEXOL 300 MG/ML  SOLN COMPARISON:  None FINDINGS: Bones/Joint/Cartilage There is no acute fracture or dislocation. No bony erosion or evidence of periosteal reaction. Ligaments Suboptimally assessed by CT. Muscles and Tendons No intramuscular fluid collection or hematoma. Soft tissues There is diffuse subcutaneous edema of the forearm and hand which may represent cellulitis. No drainable fluid collection or abscess. IMPRESSION: 1. No acute fracture or dislocation. 2. Diffuse subcutaneous edema of the forearm and hand may represent cellulitis. No drainable fluid collection or abscess. Electronically Signed   By: ArAnner Crete.D.   On: 11/30/2019 23:47   DG Chest Port 1 View  Result Date: 11/30/2019 CLINICAL DATA:  Questionable sepsis EXAM: PORTABLE CHEST 1 VIEW COMPARISON:  01/19/2013 chest radiograph. FINDINGS: Stable cardiomediastinal silhouette with normal heart size. No pneumothorax. No pleural effusion. Lungs appear clear, with no acute consolidative airspace disease and no pulmonary edema. IMPRESSION: No active disease.  Electronically Signed   By: JaIlona Sorrel.D.   On: 11/30/2019 12:21        Scheduled Meds: . ARIPiprazole  30 mg Oral Daily  . buprenorphine-naloxone  1 tablet Sublingual TID  . buPROPion  150 mg Oral Daily  . enoxaparin (LOVENOX) injection  40 mg Subcutaneous Q24H  . FLUoxetine  20 mg Oral Daily  . ketorolac  15 mg Intravenous Q6H  . lamoTRIgine  200 mg Oral BID  . QUEtiapine  300 mg Oral Daily   Continuous Infusions: . ceFEPime (MAXIPIME) IV 2 g (12/01/19 0523)  . vancomycin 1,000 mg (12/01/19 1205)     LOS: 0 days    Time spent: 25 minutes    SuSidney AceMD [facility] Pager [phone]  If 7PM-7AM, please contact night-coverage 12/01/2019, 12:28 PM

## 2019-12-01 NOTE — Consult Note (Signed)
Pharmacy Antibiotic Note  Tina Barrera is a 29 y.o. female admitted on 11/30/2019 with right hand cellulitis in the setting of IV drug use.  Pharmacy has been consulted for vancomycin and cefepime dosing. She received 2 grams IV ceftriaxone and 1500 mg IV vancomycin in the ED. Cellulitis and abscess of the hand concerning for sepsis  Plan:  1) start cefepime 2 grams IV every 8 hours  2) change to vancomycin 1000 mg IV Q 12 hrs. Predicted trough 14.   Goal trough 15-20 if concern for sepsis. Plan to obtain levels in the next 2 days if vancomycin continued.   Height: 5\' 5"  (165.1 cm) Weight: 63.5 kg (140 lb) IBW/kg (Calculated) : 57  Temp (24hrs), Avg:98.3 F (36.8 C), Min:97.9 F (36.6 C), Max:98.7 F (37.1 C)  Recent Labs  Lab 11/30/19 1144 11/30/19 1515 12/01/19 0653  WBC 11.8*  --  6.8  CREATININE 0.93  --  0.78  LATICACIDVEN 1.2 1.5  --     Estimated Creatinine Clearance: 94.2 mL/min (by C-G formula based on SCr of 0.78 mg/dL).    No Known Allergies  Antimicrobials this admission: ceftriaxone x 1 11/19 cefepime >>  11/19 vancomycin >>   Microbiology results: 11/19 BCx: pending 11/19 UCx: pending 11/19 SARS CoV-2: negative 11/19 influenza A/B: negative   Thank you for allowing pharmacy to be a part of this patient's care.  12/19 12/01/2019 11:30 AM

## 2019-12-02 DIAGNOSIS — L02519 Cutaneous abscess of unspecified hand: Secondary | ICD-10-CM | POA: Diagnosis not present

## 2019-12-02 DIAGNOSIS — L03119 Cellulitis of unspecified part of limb: Secondary | ICD-10-CM | POA: Diagnosis not present

## 2019-12-02 LAB — URINE CULTURE: Culture: NO GROWTH

## 2019-12-02 MED ORDER — ACETAMINOPHEN 325 MG PO TABS: 650.0000 mg | ORAL_TABLET | Freq: Four times a day (QID) | ORAL | Status: DC | PRN

## 2019-12-02 MED ORDER — ATOMOXETINE HCL 40 MG PO CAPS
40.0000 mg | ORAL_CAPSULE | Freq: Two times a day (BID) | ORAL | Status: DC
  Filled 2019-12-02: qty 4
  Filled 2019-12-02: qty 1

## 2019-12-02 MED ORDER — AMOXICILLIN-POT CLAVULANATE 875-125 MG PO TABS: 1.0000 | ORAL_TABLET | Freq: Two times a day (BID) | ORAL | 0 refills | Status: AC

## 2019-12-02 NOTE — Discharge Instructions (Signed)

## 2019-12-02 NOTE — Plan of Care (Signed)
  Problem: Education: Goal: Knowledge of General Education information will improve Description: Including pain rating scale, medication(s)/side effects and non-pharmacologic comfort measures Outcome: Adequate for Discharge   Problem: Health Behavior/Discharge Planning: Goal: Ability to manage health-related needs will improve Outcome: Adequate for Discharge   Problem: Clinical Measurements: Goal: Ability to maintain clinical measurements within normal limits will improve Outcome: Adequate for Discharge Goal: Will remain free from infection Outcome: Adequate for Discharge Goal: Diagnostic test results will improve Outcome: Adequate for Discharge Goal: Respiratory complications will improve Outcome: Adequate for Discharge Goal: Cardiovascular complication will be avoided Outcome: Adequate for Discharge   Problem: Activity: Goal: Risk for activity intolerance will decrease Outcome: Adequate for Discharge   Problem: Nutrition: Goal: Adequate nutrition will be maintained Outcome: Adequate for Discharge   Problem: Coping: Goal: Level of anxiety will decrease Outcome: Adequate for Discharge   Problem: Elimination: Goal: Will not experience complications related to bowel motility Outcome: Adequate for Discharge Goal: Will not experience complications related to urinary retention Outcome: Adequate for Discharge   Problem: Pain Managment: Goal: General experience of comfort will improve Outcome: Adequate for Discharge   Problem: Safety: Goal: Ability to remain free from injury will improve Outcome: Adequate for Discharge   Problem: Skin Integrity: Goal: Risk for impaired skin integrity will decrease Outcome: Adequate for Discharge   Problem: Clinical Measurements: Goal: Ability to avoid or minimize complications of infection will improve Outcome: Adequate for Discharge   Problem: Skin Integrity: Goal: Skin integrity will improve Outcome: Adequate for Discharge   

## 2019-12-02 NOTE — Discharge Summary (Signed)
Physician Discharge Summary  Tina Barrera VQM:[id number] DOB: 09-Apr-1990 DOA: 11/30/2019  PCP: Gladstone Lighter, MD  Admit date: 11/30/2019 Discharge date: 12/02/2019  Admitted From: Home Disposition: Home  Recommendations for Outpatient Follow-up:  1. Follow up with PCP in 1-2 weeks   Home Health: No Equipment/Devices: None Discharge Condition: Stable CODE STATUS: Full Diet recommendation: Regular  Brief/Interim Summary: 29 y.o.femalewith medical history significant forIV drug user, anxiety, bipolar disorder, depression presented to the emergency department for chief concerns of rapid heart rate.  [facility] and [facility] infection. At the clinic, it was noted that she had a rapid heart rate and they sent her to the emergency department.  She states she injected in multiple places and then it got redimmediately. She reports that she did not inject anymore before 11/29/19 and thatshe has been clean since March 2021.She reports that she started injecting meth on 11/29/2019. She states that she was a prior IV heroin user and that she has not used IV heroin in 5 years because she is now on Suboxone.  Patient was started on broad-spectrum antibiotics and intravenous fluids. Remained hemodynamically stable overnight. Exam slowly improving. Grip strength on the left is improving. Extensive CT imaging survey demonstrates cellulitis in multiple extremities but no evidence of abscess or deep tissue infection.  Patient's clinical exam markedly improved.  No fevers.  Normal white count.  Grip strength returned to baseline at time of discharge.  DC empiric IV antibiotics.  Follow cultures to hospital course, all cultures remain negative.  Transition to p.o. Augmentin.  Patient strongly encouraged to cease all methamphetamine use.  She expressed understanding.  Discharge Diagnoses:  Principal Problem:   Cellulitis and  abscess of hand Active Problems:   Intravenous drug abuse (HCC)   Cellulitis of right upper extremity   Cellulitis of left upper extremity   Bipolar 1 disorder (HCC)   Chronic hepatitis C without hepatic coma (HCC)   Sepsis due to cellulitis (HCC)  Cellulitis of left upper extremity Sepsis secondary to above, ruled in Sepsis criteria met with source of infection, tachycardia, leukocytosis with neutrophilic predominance No endorgan damage to suggest severe sepsis No evidence of septic shock Sepsis physiology resolved the time of discharge CT imaging survey negative for abscess Broad-spectrum empiric IV antibiotics while in house, vancomycin and cefepime Transition to p.o. Augmentin on discharge Complete 10-day of antibiotic course Stable for discharge at this time Counseled on drug cessation  Sinus tachycardia Secondary to infection, improving No chest pain  Intravenous drug abuse Provide counseling TOC consult  History of intravenous heroin use Continue Suboxone per home dose  Bipolar 1 disorder Aripiprazole 30 mg daily Seroquel 300 mg daily  Anxiety/depression Resume home regimen  Discharge Instructions  Discharge Instructions    Diet - low sodium heart healthy   Complete by: As directed    Increase activity slowly   Complete by: As directed      Allergies as of 12/02/2019   No Known Allergies     Medication List    TAKE these medications   acetaminophen 325 MG tablet Commonly known as: TYLENOL Take 2 tablets (650 mg total) by mouth every 6 (six) hours as needed for mild pain, fever or headache.   amoxicillin-clavulanate 875-125 MG tablet Commonly known as: Augmentin Take 1 tablet by mouth 2 (two) times daily for 10 days.   ARIPiprazole 30 MG tablet Commonly known as: ABILIFY Take 30 mg by mouth daily.   atomoxetine  40 MG capsule Commonly known as: STRATTERA Take 40 mg by mouth in the morning and at bedtime.   Buprenorphine HCl-Naloxone HCl  8-2 MG Film Place 1 Film under the tongue 3 (three) times daily.   divalproex 500 MG 24 hr tablet Commonly known as: DEPAKOTE ER Take 500 mg by mouth in the morning and at bedtime.   Elderberry 575 MG/5ML Syrp Take 1 Dose by mouth as directed.   FLUoxetine 20 MG capsule Commonly known as: PROZAC Take 20 mg by mouth daily.   QUEtiapine 300 MG tablet Commonly known as: SEROQUEL Take 300 mg by mouth daily.       No Known Allergies  Consultations:  None   Procedures/Studies: CT HUMERUS LEFT W CONTRAST  Result Date: 11/30/2019 CLINICAL DATA:  29 year old female with IV drug use. Concern for osteomyelitis an abscess. EXAM: CT OF THE UPPER LEFT EXTREMITY WITH CONTRAST TECHNIQUE: Multidetector CT imaging of the left upper extremity was performed according to the standard protocol following intravenous contrast administration. CONTRAST:  185mL OMNIPAQUE IOHEXOL 300 MG/ML  SOLN COMPARISON:  None FINDINGS: Bones/Joint/Cartilage There is no acute fracture or dislocation. No bony erosion or evidence of periosteal reaction. Ligaments Suboptimally assessed by CT. Muscles and Tendons No intramuscular fluid collection or hematoma. Soft tissues There is diffuse subcutaneous edema of the forearm and hand which may represent cellulitis. No drainable fluid collection or abscess. IMPRESSION: 1. No acute fracture or dislocation. 2. Diffuse subcutaneous edema of the forearm and hand may represent cellulitis. No drainable fluid collection or abscess. Electronically Signed   By: Anner Crete M.D.   On: 11/30/2019 23:47   CT HUMERUS RIGHT W CONTRAST  Result Date: 11/30/2019 CLINICAL DATA:  Cellulitis IV drug user EXAM: CT OF THE UPPER RIGHT EXTREMITY WITH CONTRAST TECHNIQUE: Multidetector CT imaging of the upper right extremity was performed according to the standard protocol following intravenous contrast administration. CONTRAST:  126mL OMNIPAQUE IOHEXOL 300 MG/ML  SOLN COMPARISON:  None. FINDINGS:  Bones/Joint/Cartilage No fracture or dislocation. No areas of cortical destruction or periosteal reaction are noted. No large joint effusions are seen. The articular surfaces are maintained. Ligaments Suboptimally assessed by CT. Muscles and Tendons The muscles surrounding the right upper extremity are intact without focal atrophy or tear. The visualized portion of the tendons are intact. Soft tissues Subcutaneous edema seen along the posterior aspect of the distal forearm and the hand. There is skin thickening and areas of the superficial induration. No loculated fluid collections or subcutaneous emphysema however are noted. IMPRESSION: Findings consistent of cellulitis involving the posterior aspect of the distal forearm and hand. No definite evidence of soft tissue abscess or osteomyelitis. Electronically Signed   By: Prudencio Pair M.D.   On: 11/30/2019 23:52   CT FOREARM LEFT W CONTRAST  Result Date: 11/30/2019 CLINICAL DATA:  29 year old female with IV drug use. Concern for osteomyelitis an abscess. EXAM: CT OF THE UPPER LEFT EXTREMITY WITH CONTRAST TECHNIQUE: Multidetector CT imaging of the left upper extremity was performed according to the standard protocol following intravenous contrast administration. CONTRAST:  177mL OMNIPAQUE IOHEXOL 300 MG/ML  SOLN COMPARISON:  None FINDINGS: Bones/Joint/Cartilage There is no acute fracture or dislocation. No bony erosion or evidence of periosteal reaction. Ligaments Suboptimally assessed by CT. Muscles and Tendons No intramuscular fluid collection or hematoma. Soft tissues There is diffuse subcutaneous edema of the forearm and hand which may represent cellulitis. No drainable fluid collection or abscess. IMPRESSION: 1. No acute fracture or dislocation. 2. Diffuse subcutaneous edema of  the forearm and hand may represent cellulitis. No drainable fluid collection or abscess. Electronically Signed   By: Anner Crete M.D.   On: 11/30/2019 23:47   CT FOREARM RIGHT  W CONTRAST  Result Date: 11/30/2019 CLINICAL DATA:  Cellulitis IV drug user EXAM: CT OF THE UPPER RIGHT EXTREMITY WITH CONTRAST TECHNIQUE: Multidetector CT imaging of the upper right extremity was performed according to the standard protocol following intravenous contrast administration. CONTRAST:  184mL OMNIPAQUE IOHEXOL 300 MG/ML  SOLN COMPARISON:  None. FINDINGS: Bones/Joint/Cartilage No fracture or dislocation. No areas of cortical destruction or periosteal reaction are noted. No large joint effusions are seen. The articular surfaces are maintained. Ligaments Suboptimally assessed by CT. Muscles and Tendons The muscles surrounding the right upper extremity are intact without focal atrophy or tear. The visualized portion of the tendons are intact. Soft tissues Subcutaneous edema seen along the posterior aspect of the distal forearm and the hand. There is skin thickening and areas of the superficial induration. No loculated fluid collections or subcutaneous emphysema however are noted. IMPRESSION: Findings consistent of cellulitis involving the posterior aspect of the distal forearm and hand. No definite evidence of soft tissue abscess or osteomyelitis. Electronically Signed   By: Prudencio Pair M.D.   On: 11/30/2019 23:52   CT HAND RIGHT W CONTRAST  Result Date: 11/30/2019 CLINICAL DATA:  Cellulitis IV drug user EXAM: CT OF THE UPPER RIGHT EXTREMITY WITH CONTRAST TECHNIQUE: Multidetector CT imaging of the upper right extremity was performed according to the standard protocol following intravenous contrast administration. CONTRAST:  125mL OMNIPAQUE IOHEXOL 300 MG/ML  SOLN COMPARISON:  None. FINDINGS: Bones/Joint/Cartilage No fracture or dislocation. No areas of cortical destruction or periosteal reaction are noted. No large joint effusions are seen. The articular surfaces are maintained. Ligaments Suboptimally assessed by CT. Muscles and Tendons The muscles surrounding the right upper extremity are intact  without focal atrophy or tear. The visualized portion of the tendons are intact. Soft tissues Subcutaneous edema seen along the posterior aspect of the distal forearm and the hand. There is skin thickening and areas of the superficial induration. No loculated fluid collections or subcutaneous emphysema however are noted. IMPRESSION: Findings consistent of cellulitis involving the posterior aspect of the distal forearm and hand. No definite evidence of soft tissue abscess or osteomyelitis. Electronically Signed   By: Prudencio Pair M.D.   On: 11/30/2019 23:52   CT HAND LEFT W CONTRAST  Result Date: 11/30/2019 CLINICAL DATA:  29 year old female with IV drug use. Concern for osteomyelitis an abscess. EXAM: CT OF THE UPPER LEFT EXTREMITY WITH CONTRAST TECHNIQUE: Multidetector CT imaging of the left upper extremity was performed according to the standard protocol following intravenous contrast administration. CONTRAST:  124mL OMNIPAQUE IOHEXOL 300 MG/ML  SOLN COMPARISON:  None FINDINGS: Bones/Joint/Cartilage There is no acute fracture or dislocation. No bony erosion or evidence of periosteal reaction. Ligaments Suboptimally assessed by CT. Muscles and Tendons No intramuscular fluid collection or hematoma. Soft tissues There is diffuse subcutaneous edema of the forearm and hand which may represent cellulitis. No drainable fluid collection or abscess. IMPRESSION: 1. No acute fracture or dislocation. 2. Diffuse subcutaneous edema of the forearm and hand may represent cellulitis. No drainable fluid collection or abscess. Electronically Signed   By: Anner Crete M.D.   On: 11/30/2019 23:47   DG Chest Port 1 View  Result Date: 11/30/2019 CLINICAL DATA:  Questionable sepsis EXAM: PORTABLE CHEST 1 VIEW COMPARISON:  01/19/2013 chest radiograph. FINDINGS: Stable cardiomediastinal silhouette with normal heart  size. No pneumothorax. No pleural effusion. Lungs appear clear, with no acute consolidative airspace disease and  no pulmonary edema. IMPRESSION: No active disease. Electronically Signed   By: Ilona Sorrel M.D.   On: 11/30/2019 12:21    (Echo, Carotid, EGD, Colonoscopy, ERCP)    Subjective: Patient seen and examined on day of discharge, stable in no acute distress.  No pain endorsed.  Stable for discharge home  Discharge Exam: Vitals:   12/02/19 0747 12/02/19 1148  BP: (!) 94/48 (!) 96/55  Pulse: 79 78  Resp: 17 16  Temp: 97.7 F (36.5 C) 98.1 F (36.7 C)  SpO2: 99% 98%   Vitals:   12/02/19 0048 12/02/19 0543 12/02/19 0747 12/02/19 1148  BP: 111/62 120/68 (!) 94/48 (!) 96/55  Pulse: (!) 101 98 79 78  Resp: $Remo'17 17 17 16  'CKCqv$ Temp: 98.9 F (37.2 C) 98.4 F (36.9 C) 97.7 F (36.5 C) 98.1 F (36.7 C)  TempSrc:  Oral Oral   SpO2: 97% 98% 99% 98%  Weight:      Height:        General: Pt is alert, awake, not in acute distress Cardiovascular: RRR, S1/S2 +, no rubs, no gallops Respiratory: CTA bilaterally, no wheezing, no rhonchi Abdominal: Soft, NT, ND, bowel sounds + Extremities: no edema, no cyanosis    The results of significant diagnostics from this hospitalization (including imaging, microbiology, ancillary and laboratory) are listed below for reference.     Microbiology: Recent Results (from the past 240 hour(s))  Urine culture     Status: None   Collection Time: 11/30/19 11:15 AM   Specimen: In/Out Cath Urine  Result Value Ref Range Status   Specimen Description   Final    IN/OUT CATH URINE Performed at [facility], [address], [place], [place] [address]    Special Requests   Final    NONE Performed at [facility], [address], [place], [place] [address]    Culture   Final    NO GROWTH Performed at [facility], [place] [address] [place], [place] [address]    Report Status 12/02/2019 FINAL  Final  Blood culture (routine single)     Status: None (Preliminary result)   Collection Time: 11/30/19 11:46 AM   Specimen: BLOOD  Result Value  Ref Range Status   Specimen Description BLOOD BLOOD LEFT FOREARM  Final   Special Requests   Final    BOTTLES DRAWN AEROBIC AND ANAEROBIC Blood Culture adequate volume   Culture   Final    NO GROWTH 2 DAYS Performed at [facility], [address], [place], [place] [address]    Report Status PENDING  Incomplete  Resp Panel by RT-PCR (Flu A&B, Covid) Nasopharyngeal Swab     Status: None   Collection Time: 11/30/19 12:30 PM   Specimen: Nasopharyngeal Swab; Nasopharyngeal(NP) swabs in vial transport medium  Result Value Ref Range Status   SARS Coronavirus 2 by RT PCR NEGATIVE NEGATIVE Final    Comment: (NOTE) SARS-CoV-2 target nucleic acids are NOT DETECTED.  The SARS-CoV-2 RNA is generally detectable in upper respiratory specimens during the acute phase of infection. The lowest concentration of SARS-CoV-2 viral copies this assay can detect is 138 copies/mL. A negative result does not preclude SARS-Cov-2 infection and should not be used as the sole basis for treatment or other patient management decisions. A negative result may occur with  improper specimen collection/handling, submission of specimen other than nasopharyngeal swab, presence of  viral mutation(s) within the areas targeted by this assay, and inadequate number of viral copies(<138 copies/mL). A negative result must be combined with clinical observations, patient history, and epidemiological information. The expected result is Negative.  Fact Sheet for Patients:  EntrepreneurPulse.com.au  Fact Sheet for Healthcare Providers:  IncredibleEmployment.be  This test is no t yet approved or cleared by the [place] FDA and  has been authorized for detection and/or diagnosis of SARS-CoV-2 by FDA under an Emergency Use Authorization (EUA). This EUA will remain  in effect (meaning this test can be used) for the duration of the COVID-19 declaration under Section 564(b)(1) of the  Act, 21 U.S.C.section 360bbb-3(b)(1), unless the authorization is terminated  or revoked sooner.       Influenza A by PCR NEGATIVE NEGATIVE Final   Influenza B by PCR NEGATIVE NEGATIVE Final    Comment: (NOTE) The Xpert Xpress SARS-CoV-2/FLU/RSV plus assay is intended as an aid in the diagnosis of influenza from Nasopharyngeal swab specimens and should not be used as a sole basis for treatment. Nasal washings and aspirates are unacceptable for Xpert Xpress SARS-CoV-2/FLU/RSV testing.  Fact Sheet for Patients: EntrepreneurPulse.com.au  Fact Sheet for Healthcare Providers: IncredibleEmployment.be  This test is not yet approved or cleared by the [place] FDA and has been authorized for detection and/or diagnosis of SARS-CoV-2 by FDA under an Emergency Use Authorization (EUA). This EUA will remain in effect (meaning this test can be used) for the duration of the COVID-19 declaration under Section 564(b)(1) of the Act, 21 U.S.C. section 360bbb-3(b)(1), unless the authorization is terminated or revoked.  Performed at [facility], [address] [place], [place] [address]   Culture, blood (single)     Status: None (Preliminary result)   Collection Time: 11/30/19  3:15 PM   Specimen: BLOOD  Result Value Ref Range Status   Specimen Description BLOOD BLOOD RIGHT HAND  Final   Special Requests   Final    BOTTLES DRAWN AEROBIC ONLY Blood Culture adequate volume   Culture   Final    NO GROWTH 2 DAYS Performed at [facility], [address] [place], [place] [address]    Report Status PENDING  Incomplete     Labs: BNP (last 3 results) No results for input(s): BNP in the last 8760 hours. Basic Metabolic Panel: Recent Labs  Lab 11/30/19 1144 12/01/19 0653  NA 133* 135  K 3.2* 3.4*  CL 99 105  CO2 23 20*  GLUCOSE 123* 89  BUN 15 11  CREATININE 0.93 0.78  CALCIUM 9.2 8.7*   Liver Function Tests: Recent Labs   Lab 11/30/19 1144  AST 136*  ALT 70*  ALKPHOS 68  BILITOT 1.3*  PROT 7.7  ALBUMIN 4.2   No results for input(s): LIPASE, AMYLASE in the last 168 hours. No results for input(s): AMMONIA in the last 168 hours. CBC: Recent Labs  Lab 11/30/19 1144 12/01/19 0653  WBC 11.8* 6.8  NEUTROABS 8.7*  --   HGB 12.6 11.2*  HCT 35.5* 32.2*  MCV 83.1 83.6  PLT 221 169   Cardiac Enzymes: No results for input(s): CKTOTAL, CKMB, CKMBINDEX, TROPONINI in the last 168 hours. BNP: Invalid input(s): POCBNP CBG: No results for input(s): GLUCAP in the last 168 hours. D-Dimer No results for input(s): DDIMER in the last 72 hours. Hgb A1c No results for input(s): HGBA1C in the last 72 hours. Lipid Profile No results for input(s): CHOL, HDL, LDLCALC, TRIG, CHOLHDL, LDLDIRECT in the last 72 hours.  Thyroid function studies No results for input(s): TSH, T4TOTAL, T3FREE, THYROIDAB in the last 72 hours.  Invalid input(s): FREET3 Anemia work up No results for input(s): VITAMINB12, FOLATE, FERRITIN, TIBC, IRON, RETICCTPCT in the last 72 hours. Urinalysis    Component Value Date/Time   COLORURINE YELLOW (A) 11/30/2019 1115   APPEARANCEUR HAZY (A) 11/30/2019 1115   APPEARANCEUR CLEAR 09/05/2012 1810   LABSPEC 1.021 11/30/2019 1115   LABSPEC >=1.030 09/05/2012 1810   PHURINE 5.0 11/30/2019 1115   GLUCOSEU NEGATIVE 11/30/2019 1115   GLUCOSEU NEGATIVE 09/05/2012 1810   HGBUR LARGE (A) 11/30/2019 1115   BILIRUBINUR NEGATIVE 11/30/2019 1115   BILIRUBINUR NEGATIVE 09/05/2012 1810   KETONESUR NEGATIVE 11/30/2019 1115   PROTEINUR NEGATIVE 11/30/2019 1115   NITRITE NEGATIVE 11/30/2019 1115   LEUKOCYTESUR NEGATIVE 11/30/2019 1115   LEUKOCYTESUR NEGATIVE 09/05/2012 1810   Sepsis Labs Invalid input(s): PROCALCITONIN,  WBC,  LACTICIDVEN Microbiology Recent Results (from the past 240 hour(s))  Urine culture     Status: None   Collection Time: 11/30/19 11:15 AM   Specimen: In/Out Cath Urine  Result  Value Ref Range Status   Specimen Description   Final    IN/OUT CATH URINE Performed at [facility], [address], [place], [place] [address]    Special Requests   Final    NONE Performed at [facility], [address], [place], [place] [address]    Culture   Final    NO GROWTH Performed at [facility], [place] [address] [place], [place] [address]    Report Status 12/02/2019 FINAL  Final  Blood culture (routine single)     Status: None (Preliminary result)   Collection Time: 11/30/19 11:46 AM   Specimen: BLOOD  Result Value Ref Range Status   Specimen Description BLOOD BLOOD LEFT FOREARM  Final   Special Requests   Final    BOTTLES DRAWN AEROBIC AND ANAEROBIC Blood Culture adequate volume   Culture   Final    NO GROWTH 2 DAYS Performed at [facility], [address] [place],  [address]    Report Status PENDING  Incomplete  Resp Panel by RT-PCR (Flu A&B, Covid) Nasopharyngeal Swab     Status: None   Collection Time: 11/30/19 12:30 PM   Specimen: Nasopharyngeal Swab; Nasopharyngeal(NP) swabs in vial transport medium  Result Value Ref Range Status   SARS Coronavirus 2 by RT PCR NEGATIVE NEGATIVE Final    Comment: (NOTE) SARS-CoV-2 target nucleic acids are NOT DETECTED.  The SARS-CoV-2 RNA is generally detectable in upper respiratory specimens during the acute phase of infection. The lowest concentration of SARS-CoV-2 viral copies this assay can detect is 138 copies/mL. A negative result does not preclude SARS-Cov-2 infection and should not be used as the sole basis for treatment or other patient management decisions. A negative result may occur with  improper specimen collection/handling, submission of specimen other than nasopharyngeal swab, presence of viral mutation(s) within the areas targeted by this assay, and inadequate number of viral copies(<138 copies/mL). A negative result must be combined with clinical observations,  patient history, and epidemiological information. The expected result is Negative.  Fact Sheet for Patients:  EntrepreneurPulse.com.au  Fact Sheet for Healthcare Providers:  IncredibleEmployment.be  This test is no t yet approved or cleared by the [place] FDA and  has been authorized for detection and/or diagnosis of SARS-CoV-2 by FDA under an Emergency Use Authorization (EUA). This EUA will remain  in effect (meaning this test can  be used) for the duration of the COVID-19 declaration under Section 564(b)(1) of the Act, 21 U.S.C.section 360bbb-3(b)(1), unless the authorization is terminated  or revoked sooner.       Influenza A by PCR NEGATIVE NEGATIVE Final   Influenza B by PCR NEGATIVE NEGATIVE Final    Comment: (NOTE) The Xpert Xpress SARS-CoV-2/FLU/RSV plus assay is intended as an aid in the diagnosis of influenza from Nasopharyngeal swab specimens and should not be used as a sole basis for treatment. Nasal washings and aspirates are unacceptable for Xpert Xpress SARS-CoV-2/FLU/RSV testing.  Fact Sheet for Patients: EntrepreneurPulse.com.au  Fact Sheet for Healthcare Providers: IncredibleEmployment.be  This test is not yet approved or cleared by the [place] FDA and has been authorized for detection and/or diagnosis of SARS-CoV-2 by FDA under an Emergency Use Authorization (EUA). This EUA will remain in effect (meaning this test can be used) for the duration of the COVID-19 declaration under Section 564(b)(1) of the Act, 21 U.S.C. section 360bbb-3(b)(1), unless the authorization is terminated or revoked.  Performed at [facility], [address] [place], [place] [address]   Culture, blood (single)     Status: None (Preliminary result)   Collection Time: 11/30/19  3:15 PM   Specimen: BLOOD  Result Value Ref Range Status   Specimen Description BLOOD BLOOD RIGHT HAND  Final    Special Requests   Final    BOTTLES DRAWN AEROBIC ONLY Blood Culture adequate volume   Culture   Final    NO GROWTH 2 DAYS Performed at [facility], [address] [place], [place] [address]    Report Status PENDING  Incomplete     Time coordinating discharge: Over 30 minutes  SIGNED:   Sidney Ace, MD  [facility] 12/02/2019, 12:51 PM Pager   If 7PM-7AM, please contact night-coverage

## 2019-12-05 LAB — CULTURE, BLOOD (SINGLE)
Culture: NO GROWTH
Culture: NO GROWTH
Special Requests: ADEQUATE
Special Requests: ADEQUATE

## 2020-01-31 ENCOUNTER — Other Ambulatory Visit: Payer: Medicaid Other

## 2020-01-31 ENCOUNTER — Other Ambulatory Visit: Payer: Self-pay

## 2020-01-31 DIAGNOSIS — Z20822 Contact with and (suspected) exposure to covid-19: Secondary | ICD-10-CM

## 2020-02-02 LAB — NOVEL CORONAVIRUS, NAA: SARS-CoV-2, NAA: DETECTED — AB

## 2020-02-02 LAB — SARS-COV-2, NAA 2 DAY TAT

## 2020-04-07 ENCOUNTER — Other Ambulatory Visit: Payer: Self-pay

## 2020-04-07 DIAGNOSIS — X58XXXA Exposure to other specified factors, initial encounter: Secondary | ICD-10-CM | POA: Diagnosis not present

## 2020-04-07 DIAGNOSIS — F17210 Nicotine dependence, cigarettes, uncomplicated: Secondary | ICD-10-CM | POA: Insufficient documentation

## 2020-04-07 DIAGNOSIS — S00551A Superficial foreign body of lip, initial encounter: Secondary | ICD-10-CM | POA: Insufficient documentation

## 2020-04-07 NOTE — ED Triage Notes (Signed)
Pt states that her lip piercing went into gums due to swelling around site and now is not able to remove piercing or remove the end for inside gums.

## 2020-04-08 ENCOUNTER — Emergency Department
Admission: EM | Admit: 2020-04-08 | Discharge: 2020-04-08 | Disposition: A | Discharge status: Home / Self Care | Payer: Medicaid Other | Attending: Emergency Medicine | Admitting: Emergency Medicine

## 2020-04-08 DIAGNOSIS — S00551A Superficial foreign body of lip, initial encounter: Secondary | ICD-10-CM

## 2020-04-08 MED ORDER — AMOXICILLIN-POT CLAVULANATE 875-125 MG PO TABS
1.0000 | ORAL_TABLET | Freq: Once | ORAL | Status: AC
  Administered 2020-04-08: 1 via ORAL
  Filled 2020-04-08: qty 1

## 2020-04-08 MED ORDER — AMOXICILLIN-POT CLAVULANATE 875-125 MG PO TABS: 1.0000 | ORAL_TABLET | Freq: Two times a day (BID) | ORAL | 0 refills | Status: AC

## 2020-04-08 MED ORDER — LIDOCAINE HCL (PF) 1 % IJ SOLN
5.0000 mL | Freq: Once | INTRAMUSCULAR | Status: AC
  Administered 2020-04-08: 5 mL via INTRADERMAL
  Filled 2020-04-08: qty 5

## 2020-04-08 MED ORDER — CHLORHEXIDINE GLUCONATE 0.12 % MT SOLN: 15.0000 mL | Freq: Two times a day (BID) | OROMUCOSAL | 0 refills | Status: DC

## 2020-04-08 NOTE — Discharge Instructions (Addendum)
You may alternate Tylenol 1000 mg every 6 hours as needed for pain, fever and Ibuprofen 800 mg every 8 hours as needed for pain, fever.  Please take Ibuprofen with food.  Do not take more than 4000 mg of Tylenol (acetaminophen) in a 24 hour period.  

## 2020-04-08 NOTE — ED Notes (Signed)
ED Provider at bedside. 

## 2020-04-08 NOTE — ED Provider Notes (Addendum)
[facility] Emergency Department Provider Note  ____________________________________________   Event Date/Time   First MD Initiated Contact with Patient 04/08/20 0501     (approximate)  I have reviewed the triage vital signs and the nursing notes.   HISTORY  Chief Complaint Foreign Body    HPI Tina Barrera is a 30 y.o. female with history of methamphetamine abuse who presents to the emergency department with complaints of right lower lip pain from her lip piercing.  States she had piercings placed in her lower lip on both sides about a year ago.  She states yesterday she noticed her lower lip on the right side was painful, swollen and she could not remove her piercing.  She states the ball on the piercing on the inside of her lip has gone into her lip and she has not been able to remove it.  No fevers, vomiting.  She is not a diabetic.  She is concerned this area is infected.        Past Medical History:  Diagnosis Date  . Anxiety   . Depression   . Herpes   . Staph skin infection     Patient Active Problem List   Diagnosis Date Noted  . Sepsis due to cellulitis (HCC) 12/01/2019  . Cellulitis and abscess of hand 11/30/2019  . Intravenous drug abuse (HCC) 11/30/2019  . Cellulitis of right upper extremity 11/30/2019  . Cellulitis of left upper extremity 11/30/2019  . Bipolar 1 disorder (HCC) 11/30/2019  . Chronic hepatitis C without hepatic coma (HCC) 11/30/2019    Past Surgical History:  Procedure Laterality Date  . NO PAST SURGERIES      Prior to Admission medications   Medication Sig Start Date End Date Taking? Authorizing Provider  amoxicillin-clavulanate (AUGMENTIN) 875-125 MG tablet Take 1 tablet by mouth 2 (two) times daily for 7 days. 04/08/20 04/15/20 Yes Earlyne Feeser, Layla MawKristen N, DO  chlorhexidine (PERIDEX) 0.12 % solution Use as directed 15 mLs in the mouth or throat 2 (two) times daily. 04/08/20  Yes Alexah Kivett, Layla MawKristen N, DO   acetaminophen (TYLENOL) 325 MG tablet Take 2 tablets (650 mg total) by mouth every 6 (six) hours as needed for mild pain, fever or headache. 12/02/19   Lolita PatellaSreenath, Sudheer B, MD  ARIPiprazole (ABILIFY) 30 MG tablet Take 30 mg by mouth daily. 11/09/19   [provider]  atomoxetine (STRATTERA) 40 MG capsule Take 40 mg by mouth in the morning and at bedtime.     [provider]  Buprenorphine HCl-Naloxone HCl 8-2 MG FILM Place 1 Film under the tongue 3 (three) times daily.     [provider]  divalproex (DEPAKOTE ER) 500 MG 24 hr tablet Take 500 mg by mouth in the morning and at bedtime.    [provider]  Elderberry 575 MG/5ML SYRP Take 1 Dose by mouth as directed.    [provider]  FLUoxetine (PROZAC) 20 MG capsule Take 20 mg by mouth daily.     [provider]  QUEtiapine (SEROQUEL) 300 MG tablet Take 300 mg by mouth daily.     [provider]    Allergies Patient has no known allergies.  Family History  Problem Relation Age of Onset  . Healthy Mother   . Healthy Father     Social History Social History   Tobacco Use  . Smoking status: Current Every Day Smoker    Packs/day: 1.00    Types: Cigarettes  . Smokeless tobacco: Never  Used  Vaping Use  . Vaping Use: Some days  Substance Use Topics  . Alcohol use: Not Currently  . Drug use: Yes    Types: Methamphetamines    Comment: A few days ago    Review of Systems Constitutional: No fever. Eyes: No visual changes. ENT: No sore throat. Cardiovascular: Denies chest pain. Respiratory: Denies shortness of breath. Gastrointestinal: No nausea, vomiting, diarrhea. Genitourinary: Negative for dysuria. Musculoskeletal: Negative for back pain. Skin: Negative for rash. Neurological: Negative for focal weakness or numbness.  ____________________________________________   PHYSICAL EXAM:  VITAL SIGNS: ED Triage Vitals [04/07/20 2227]  Enc Vitals Group     BP (!)  161/103     Pulse Rate (!) 118     Resp 20     Temp 98.6 F (37 C)     Temp Source Oral     SpO2 100 %     Weight 140 lb (63.5 kg)     Height 5\' 5"  (1.651 m)     Head Circumference      Peak Flow      Pain Score 10     Pain Loc      Pain Edu?      Excl. in GC?    CONSTITUTIONAL: Alert and oriented and responds appropriately to questions. Well-appearing; well-nourished HEAD: Normocephalic EYES: Conjunctivae clear, pupils appear equal, EOM appear intact ENT: normal nose; moist mucous membranes; no significant swelling noted to her lips, patient has 2 ring piercings noted to either side of the lower lip.  The right ring piercing is embedded in the inside of the lip and I am unable to see the ball of the piercing.  When trying to manipulate this area there is a small amount of purulent drainage that comes out of the inside of the lip.  She has normal phonation, no stridor, trismus or drooling. NECK: Supple, normal ROM CARD: RRR; S1 and S2 appreciated; no murmurs, no clicks, no rubs, no gallops RESP: Normal chest excursion without splinting or tachypnea; breath sounds clear and equal bilaterally; no wheezes, no rhonchi, no rales, no hypoxia or respiratory distress, speaking full sentences ABD/GI: Normal bowel sounds; non-distended; soft, non-tender, no rebound, no guarding, no peritoneal signs, no hepatosplenomegaly BACK: The back appears normal EXT: Normal ROM in all joints; no deformity noted, no edema; no cyanosis SKIN: Normal color for age and race; warm; no rash on exposed skin NEURO: Moves all extremities equally PSYCH: The patient's mood and manner are appropriate.  ____________________________________________   LABS (all labs ordered are listed, but only abnormal results are displayed)  Labs Reviewed - No data to display ____________________________________________  EKG  None ____________________________________________  RADIOLOGY I, Jove Beyl, personally viewed and  evaluated these images (plain radiographs) as part of my medical decision making, as well as reviewing the written report by the radiologist.  ED MD interpretation: None  Official radiology report(s): No results found.  ____________________________________________   PROCEDURES  Procedure(s) performed (including Critical Care):  .Foreign Body Removal  Date/Time: 04/08/2020 5:49 AM Performed by: Chace Bisch, 04/10/2020, DO Authorized by: Japhet Morgenthaler, Layla Maw, DO  Consent: Verbal consent obtained. Risks and benefits: risks, benefits and alternatives were discussed Consent given by: patient Patient understanding: patient states understanding of the procedure being performed Patient consent: the patient's understanding of the procedure matches consent given Procedure consent: procedure consent matches procedure scheduled Relevant documents: relevant documents present and verified Test results: test results available and properly labeled Site marked: the operative site was marked  Imaging studies: imaging studies available Required items: required blood products, implants, devices, and special equipment available Patient identity confirmed: verbally with patient Time out: Immediately prior to procedure a "time out" was called to verify the correct patient, procedure, equipment, support staff and site/side marked as required. Body area: mucosa (lip) Anesthesia: local infiltration  Anesthesia: Local Anesthetic: lidocaine 1% without epinephrine Anesthetic total: 2 mL  Sedation: Patient sedated: no  Patient restrained: no Patient cooperative: yes Removal mechanism: hemostat Depth: deep Complexity: complex 1 objects recovered. Objects recovered: lip ring Post-procedure assessment: foreign body removed Patient tolerance: patient tolerated the procedure well with no immediate complications    ____________________________________________   INITIAL IMPRESSION / ASSESSMENT AND PLAN / ED  COURSE  As part of my medical decision making, I reviewed the following data within the electronic MEDICAL RECORD NUMBER Nursing notes reviewed and incorporated, Old chart reviewed, Notes from prior ED visits and [place] Controlled Substance Database         Patient here with her lip ring embedded in her lip.  Unable to manipulate it without significant pain.  Will anesthetize this area and try again.  Given there was some purulent drainage, will place on Augmentin.  ED PROGRESS  Lip piercing removed without incident.  I have also remove the noninfected lip piercing and has advised her to keep both of these out for at least the next month.  She verbalized understanding.  Recommend alternating Tylenol and Motrin for pain.  Will discharge on Augmentin with Peridex.  She has outpatient follow-up as needed.  Discussed wound care instructions and return precautions.  At this time, I do not feel there is any life-threatening condition present. I have reviewed, interpreted and discussed all results (EKG, imaging, lab, urine as appropriate) and exam findings with patient/family. I have reviewed nursing notes and appropriate previous records.  I feel the patient is safe to be discharged home without further emergent workup and can continue workup as an outpatient as needed. Discussed usual and customary return precautions. Patient/family verbalize understanding and are comfortable with this plan.  Outpatient follow-up has been provided as needed. All questions have been answered.  ____________________________________________   FINAL CLINICAL IMPRESSION(S) / ED DIAGNOSES  Final diagnoses:  Foreign body in lip, initial encounter     ED Discharge Orders         Ordered    amoxicillin-clavulanate (AUGMENTIN) 875-125 MG tablet  2 times daily        04/08/20 0549    chlorhexidine (PERIDEX) 0.12 % solution  2 times daily        04/08/20 0549          *Please note:  Tina Barrera was evaluated in  Emergency Department on 04/08/2020 for the symptoms described in the history of present illness. She was evaluated in the context of the global COVID-19 pandemic, which necessitated consideration that the patient might be at risk for infection with the SARS-CoV-2 virus that causes COVID-19. Institutional protocols and algorithms that pertain to the evaluation of patients at risk for COVID-19 are in a state of rapid change based on information released by regulatory bodies including the CDC and federal and state organizations. These policies and algorithms were followed during the patient's care in the ED.  Some ED evaluations and interventions may be delayed as a result of limited staffing during and the pandemic.*   Note:  This document was prepared using Dragon voice recognition software and may include unintentional dictation errors.  Nadalie Laughner, Layla Maw, DO 04/08/20 [phone]

## 2020-04-08 NOTE — ED Notes (Addendum)
Pt c/o piercing in lower, right side of lip that has grown over and she is not able to remove. Sts she noted swelling, drainage, and discoloration to the lip yesterday. Hx of meth use and sts she relapsed yesterday.  Denies any fever.

## 2020-06-15 ENCOUNTER — Other Ambulatory Visit: Payer: Self-pay

## 2020-06-15 ENCOUNTER — Ambulatory Visit
Admission: EM | Admit: 2020-06-15 | Discharge: 2020-06-15 | Disposition: A | Discharge status: Home / Self Care | Payer: Medicaid Other | Attending: Family Medicine | Admitting: Family Medicine

## 2020-06-15 ENCOUNTER — Encounter: Payer: Self-pay | Admitting: Emergency Medicine

## 2020-06-15 DIAGNOSIS — A6004 Herpesviral vulvovaginitis: Secondary | ICD-10-CM

## 2020-06-15 MED ORDER — VALACYCLOVIR HCL 500 MG PO TABS: 500.0000 mg | ORAL_TABLET | Freq: Two times a day (BID) | ORAL | 2 refills | Status: AC

## 2020-06-15 NOTE — ED Provider Notes (Addendum)
[facility]    CSN: [id number] Arrival date & time: 06/15/20  1553      History   Chief Complaint Chief Complaint  Patient presents with  . Medication Refill   HPI  30 year old female presents with a genital herpes outbreak.  Patient reports that she is having a genital herpes outbreak.  She states that she recently had 1 and it resolved.  She states that she subsequently developed vaginal lesions and vaginal pain on Friday.  She is requesting valacyclovir.  She states that her pain is 7/10 in severity.  She declines GU examination today.  Past Medical History:  Diagnosis Date  . Anxiety   . Depression   . Herpes   . Staph skin infection     Patient Active Problem List   Diagnosis Date Noted  . Sepsis due to cellulitis (HCC) 12/01/2019  . Cellulitis and abscess of hand 11/30/2019  . Intravenous drug abuse (HCC) 11/30/2019  . Cellulitis of right upper extremity 11/30/2019  . Cellulitis of left upper extremity 11/30/2019  . Bipolar 1 disorder (HCC) 11/30/2019  . Chronic hepatitis C without hepatic coma (HCC) 11/30/2019    Past Surgical History:  Procedure Laterality Date  . NO PAST SURGERIES      OB History   No obstetric history on file.      Home Medications    Prior to Admission medications   Medication Sig Start Date End Date Taking? Authorizing Provider  valACYclovir (VALTREX) 500 MG tablet Take 1 tablet (500 mg total) by mouth 2 (two) times daily for 7 days. 06/15/20 06/22/20 Yes Romyn Boswell G, DO  acetaminophen (TYLENOL) 325 MG tablet Take 2 tablets (650 mg total) by mouth every 6 (six) hours as needed for mild pain, fever or headache. 12/02/19   Lolita Patella B, MD  ARIPiprazole (ABILIFY) 30 MG tablet Take 30 mg by mouth daily. 11/09/19   [provider]  atomoxetine (STRATTERA) 40 MG capsule Take 40 mg by mouth in the morning and at bedtime.     [provider]  Buprenorphine HCl-Naloxone HCl 8-2 MG FILM Place 1 Film  under the tongue 3 (three) times daily.     [provider]  chlorhexidine (PERIDEX) 0.12 % solution Use as directed 15 mLs in the mouth or throat 2 (two) times daily. 04/08/20   Ward, Layla Maw, DO  divalproex (DEPAKOTE ER) 500 MG 24 hr tablet Take 500 mg by mouth in the morning and at bedtime.    [provider]  Elderberry 575 MG/5ML SYRP Take 1 Dose by mouth as directed.    [provider]  FLUoxetine (PROZAC) 20 MG capsule Take 20 mg by mouth daily.     [provider]  QUEtiapine (SEROQUEL) 300 MG tablet Take 300 mg by mouth daily.     [provider]    Family History Family History  Problem Relation Age of Onset  . Healthy Mother   . Healthy Father     Social History Social History   Tobacco Use  . Smoking status: Current Every Day Smoker    Packs/day: 1.00    Types: Cigarettes  . Smokeless tobacco: Never Used  Vaping Use  . Vaping Use: Some days  Substance Use Topics  . Alcohol use: Not Currently  . Drug use: Yes    Types: Methamphetamines    Comment: A few days ago     Allergies   Patient has no known allergies.   Review  of Systems Review of Systems  Constitutional: Negative.   Genitourinary: Positive for genital sores and vaginal pain.   Physical Exam Triage Vital Signs ED Triage Vitals [06/15/20 1608]  Enc Vitals Group     BP (!) 151/107     Pulse Rate 100     Resp 14     Temp 98.7 F (37.1 C)     Temp Source Oral     SpO2 100 %     Weight      Height      Head Circumference      Peak Flow      Pain Score 7     Pain Loc      Pain Edu?      Excl. in GC?    Updated Vital Signs BP (!) 151/107 (BP Location: Left Arm)   Pulse 100   Temp 98.7 F (37.1 C) (Oral)   Resp 14   LMP 05/25/2020   SpO2 100%   Visual Acuity Right Eye Distance:   Left Eye Distance:   Bilateral Distance:    Right Eye Near:   Left Eye Near:    Bilateral Near:     Physical Exam Vitals and nursing note reviewed.   Constitutional:      General: She is not in acute distress.    Appearance: Normal appearance. She is not ill-appearing.  HENT:     Head: Normocephalic and atraumatic.  Eyes:     General:        Right eye: No discharge.        Left eye: No discharge.     Conjunctiva/sclera: Conjunctivae normal.  Pulmonary:     Effort: Pulmonary effort is normal. No respiratory distress.  Neurological:     Mental Status: She is alert.  Psychiatric:        Mood and Affect: Mood normal.        Behavior: Behavior normal.    UC Treatments / Results  Labs (all labs ordered are listed, but only abnormal results are displayed) Labs Reviewed - No data to display  EKG   Radiology No results found.  Procedures Procedures (including critical care time)  Medications Ordered in UC Medications - No data to display  Initial Impression / Assessment and Plan / UC Course  I have reviewed the triage vital signs and the nursing notes.  Pertinent labs & imaging results that were available during my care of the patient were reviewed by me and considered in my medical decision making (see chart for details).    30 year old female presents with a genital herpes outbreak.  This is an acute exacerbation of a chronic problem.  Treating with Valtrex.  Final Clinical Impressions(s) / UC Diagnoses   Final diagnoses:  Herpes simplex vulvovaginitis   Discharge Instructions   None    ED Prescriptions    Medication Sig Dispense Auth. Provider   valACYclovir (VALTREX) 500 MG tablet Take 1 tablet (500 mg total) by mouth 2 (two) times daily for 7 days. 14 tablet Tommie Sams, DO     PDMP not reviewed this encounter.      Tommie Sams, [place] 06/15/20 [phone]

## 2020-06-15 NOTE — ED Triage Notes (Signed)
Pt needs refill on Valcyclovir due to herpes outbreak onset Friday. She states it is very painful. She states her last outbreak was about 2 weeks ago and subsided. She ran out of her medication a few months ago and her PCP would not refill it. She denies any other symptoms.
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
## 2021-02-12 ENCOUNTER — Ambulatory Visit
Admission: EM | Admit: 2021-02-12 | Discharge: 2021-02-12 | Disposition: A | Discharge status: Home / Self Care | Payer: Medicaid Other | Attending: Emergency Medicine | Admitting: Emergency Medicine

## 2021-02-12 ENCOUNTER — Other Ambulatory Visit: Payer: Self-pay

## 2021-02-12 DIAGNOSIS — B3731 Acute candidiasis of vulva and vagina: Secondary | ICD-10-CM | POA: Diagnosis present

## 2021-02-12 DIAGNOSIS — R21 Rash and other nonspecific skin eruption: Secondary | ICD-10-CM | POA: Diagnosis present

## 2021-02-12 DIAGNOSIS — B9689 Other specified bacterial agents as the cause of diseases classified elsewhere: Secondary | ICD-10-CM | POA: Diagnosis present

## 2021-02-12 DIAGNOSIS — N760 Acute vaginitis: Secondary | ICD-10-CM

## 2021-02-12 DIAGNOSIS — R300 Dysuria: Secondary | ICD-10-CM

## 2021-02-12 LAB — URINALYSIS, COMPLETE (UACMP) WITH MICROSCOPIC
Bilirubin Urine: NEGATIVE
Glucose, UA: NEGATIVE mg/dL
Nitrite: NEGATIVE
Protein, ur: 100 mg/dL — AB
Specific Gravity, Urine: 1.02 (ref 1.005–1.030)
pH: 6.5 (ref 5.0–8.0)

## 2021-02-12 LAB — PREGNANCY, URINE: Preg Test, Ur: NEGATIVE

## 2021-02-12 LAB — WET PREP, GENITAL
Sperm: NONE SEEN
Trich, Wet Prep: NONE SEEN
WBC, Wet Prep HPF POC: <10 — AB (ref <10)

## 2021-02-12 MED ORDER — CEPHALEXIN 500 MG PO CAPS: 500.0000 mg | ORAL_CAPSULE | Freq: Four times a day (QID) | ORAL | 0 refills | Status: DC

## 2021-02-12 MED ORDER — FLUCONAZOLE 150 MG PO TABS: 150.0000 mg | ORAL_TABLET | Freq: Once | ORAL | 1 refills | Status: AC

## 2021-02-12 MED ORDER — PHENAZOPYRIDINE HCL 200 MG PO TABS: 200.0000 mg | ORAL_TABLET | Freq: Three times a day (TID) | ORAL | 0 refills | Status: DC | PRN

## 2021-02-12 MED ORDER — METRONIDAZOLE 500 MG PO TABS: 500.0000 mg | ORAL_TABLET | Freq: Two times a day (BID) | ORAL | 0 refills | Status: AC

## 2021-02-12 MED ORDER — CEPHALEXIN 500 MG PO CAPS: 500.0000 mg | ORAL_CAPSULE | Freq: Four times a day (QID) | ORAL | 0 refills | Status: AC

## 2021-02-12 NOTE — ED Triage Notes (Signed)
Pt present urinary frequency with burning and painful urination symptoms started a few days ago.

## 2021-02-12 NOTE — Discharge Instructions (Addendum)
Your wet prep came back positive for yeast and BV.  I am treating this with Diflucan and Flagyl, respectively.  This may be the cause of your urinary symptoms.  I have sent your urine off for culture.  We will base further treatment off of the results.  You can take the Pyridium for urinary symptoms.  Make sure you drink plenty of extra fluids.  No intercourse until all your symptoms have resolved, you have finished your medication and all of your labs are back.  Take the Keflex for your skin.  Follow-up with your doctor if this does not clear it up.

## 2021-02-12 NOTE — ED Provider Notes (Signed)
HPI  SUBJECTIVE:  Tina Barrera is a 31 y.o. female who presents with 2 issues: First, she reports dysuria for a few days.  She reports urinary urgency, frequency.  No cloudy or odorous urine, hematuria, vaginal odor, bleeding, discharge, rash, itching.  No vomiting, fevers, abdominal, pelvic, back pain.  She is in a long-term monogamous relationship with a female, who is asymptomatic.  STDs are not a concern today.  She has tried Cystex with improvement in her symptoms.  No aggravating factors.  No recent antibiotics.  Second, she reports a pruritic, burning rash on her hands and face, arms, legs, groin present for the past few months.  She states it was recently diagnosed as folliculitis, and she was started on Hibiclens with some improvement in her symptoms.  No aggravating factors.  She reports pustules, open wounds.  No fevers, signs of abscesses, cellulitis.  She denies picking at her self.  No contacts with similar rash, itching worse at night.  No new lotions, soaps, detergents, change in her medications.  No exposure to scabies, fleas, bedbugs.  No sensation being bitten at night, blood on the bedclothes in the morning.  She has a past medical history of UTI, chlamydia, HSV-2, BV, yeast, MRSA, sepsis from cellulitis, IV drug use.  States that she still occasionally uses methamphetamine, but only intranasally.  She denies recurrent IVDU.  No history of gonorrhea, HIV, syphilis, trichomonas, nephrolithiasis.  LMP: Last month.  Not sure if she could be pregnant.  PMD: [facility].  Past Medical History:  Diagnosis Date   Anxiety    Depression    Herpes    Staph skin infection     Past Surgical History:  Procedure Laterality Date   NO PAST SURGERIES      Family History  Problem Relation Age of Onset   Healthy Mother    Healthy Father     Social History   Tobacco Use   Smoking status: Every Day    Packs/day: 1.00    Types: Cigarettes   Smokeless tobacco: Never   Vaping Use   Vaping Use: Some days  Substance Use Topics   Alcohol use: Not Currently   Drug use: Yes    Types: Methamphetamines    Comment: A few days ago    No current facility-administered medications for this encounter.  Current Outpatient Medications:    fluconazole (DIFLUCAN) 150 MG tablet, Take 1 tablet (150 mg total) by mouth once for 1 dose. 1 tab po x 1. May repeat in 72 hours if no improvement, Disp: 2 tablet, Rfl: 1   metroNIDAZOLE (FLAGYL) 500 MG tablet, Take 1 tablet (500 mg total) by mouth 2 (two) times daily for 7 days., Disp: 14 tablet, Rfl: 0   phenazopyridine (PYRIDIUM) 200 MG tablet, Take 1 tablet (200 mg total) by mouth 3 (three) times daily as needed for pain., Disp: 6 tablet, Rfl: 0   acetaminophen (TYLENOL) 325 MG tablet, Take 2 tablets (650 mg total) by mouth every 6 (six) hours as needed for mild pain, fever or headache., Disp: , Rfl:    ARIPiprazole (ABILIFY) 30 MG tablet, Take 30 mg by mouth daily., Disp: , Rfl:    atomoxetine (STRATTERA) 40 MG capsule, Take 40 mg by mouth in the morning and at bedtime. , Disp: , Rfl:    Buprenorphine HCl-Naloxone HCl 8-2 MG FILM, Place 1 Film under the tongue 3 (three) times daily. , Disp: , Rfl:    cephALEXin (KEFLEX) 500 MG capsule,  Take 1 capsule (500 mg total) by mouth 4 (four) times daily for 5 days., Disp: 20 capsule, Rfl: 0   chlorhexidine (PERIDEX) 0.12 % solution, Use as directed 15 mLs in the mouth or throat 2 (two) times daily., Disp: 120 mL, Rfl: 0   divalproex (DEPAKOTE ER) 500 MG 24 hr tablet, Take 500 mg by mouth in the morning and at bedtime., Disp: , Rfl:    [place] 575 MG/5ML SYRP, Take 1 Dose by mouth as directed., Disp: , Rfl:    FLUoxetine (PROZAC) 20 MG capsule, Take 20 mg by mouth daily. , Disp: , Rfl:    QUEtiapine (SEROQUEL) 300 MG tablet, Take 300 mg by mouth daily. , Disp: , Rfl:   No Known Allergies   ROS  As noted in HPI.   Physical Exam  BP 139/86 (BP Location: Left Arm)    Pulse 88     Temp 98.5 F (36.9 C) (Oral)    Resp 16    SpO2 98%   Constitutional: Well developed, well nourished, no acute distress Eyes:  EOMI, conjunctiva normal bilaterally HENT: Normocephalic, atraumatic,mucus membranes moist Respiratory: Normal inspiratory effort Cardiovascular: Normal rate GI: nondistended.  Positive suprapubic tenderness.  No flank tenderness. Back: No CVAT skin: Flat erythematous rash on the backs of her hands with a scabbed lesion.  Unable to appreciate any rash on her forearms, face.      Musculoskeletal: no deformities Neurologic: Alert & oriented x 3, no focal neuro deficits Psychiatric: Speech and behavior appropriate  ED Course   Medications - No data to display  Orders Placed This Encounter  Procedures   Urine Culture    Standing Status:   Standing    Number of Occurrences:   1    Order Specific Question:   Indication    Answer:   Dysuria   Wet prep, genital    Standing Status:   Standing    Number of Occurrences:   1   Urinalysis, Complete w Microscopic Urine, Clean Catch    Standing Status:   Standing    Number of Occurrences:   1    Order Specific Question:   Release to patient    Answer:   Immediate   Pregnancy, urine    Standing Status:   Standing    Number of Occurrences:   1   Urinalysis, Routine w reflex microscopic Urine, Clean Catch    Standing Status:   Standing    Number of Occurrences:   1    Results for orders placed or performed during the hospital encounter of 02/12/21 (from the past 24 hour(s))  Urinalysis, Complete w Microscopic Urine, Clean Catch     Status: Abnormal   Collection Time: 02/12/21  3:09 PM  Result Value Ref Range   Color, Urine YELLOW YELLOW   APPearance HAZY (A) CLEAR   Specific Gravity, Urine 1.020 1.005 - 1.030   pH 6.5 5.0 - 8.0   Glucose, UA NEGATIVE NEGATIVE mg/dL   Hgb urine dipstick TRACE (A) NEGATIVE   Bilirubin Urine NEGATIVE NEGATIVE   Ketones, ur TRACE (A) NEGATIVE mg/dL   Protein, ur 100 (A)  NEGATIVE mg/dL   Nitrite NEGATIVE NEGATIVE   Leukocytes,Ua TRACE (A) NEGATIVE   Squamous Epithelial / LPF 21-50 0 - 5   WBC, UA 6-10 0 - 5 WBC/hpf   RBC / HPF 0-5 0 - 5 RBC/hpf   Bacteria, UA FEW (A) NONE SEEN  Pregnancy, urine     Status: None  Collection Time: 02/12/21  3:09 PM  Result Value Ref Range   Preg Test, Ur NEGATIVE NEGATIVE  Wet prep, genital     Status: Abnormal   Collection Time: 02/12/21  4:30 PM   Specimen: Urine, Clean Catch  Result Value Ref Range   Yeast Wet Prep HPF POC PRESENT (A) NONE SEEN   Trich, Wet Prep NONE SEEN NONE SEEN   Clue Cells Wet Prep HPF POC PRESENT (A) NONE SEEN   WBC, Wet Prep HPF POC <10 (A) <10   Sperm NONE SEEN    No results found.  ED Clinical Impression  1. Dysuria   2. Rash and nonspecific skin eruption   3. BV (bacterial vaginosis)   4. Yeast vaginitis      ED Assessment/Plan  1.  Dysuria.  Initial specimen was contaminated.attempted to get a second specimen, however, it was accidentally dropped.  sending initial specimen off for culture to confirm presence or absence of UTI.  Urine pregnancy negative.  She has yeast and BV on wet prep.  Will send home with Pyridium, Diflucan, Flagyl.  Swabs for GC, chlamydia sent.  Will base further treatment off of labs.   2.  Rash.  It does not appear to be a classic folliculitis, however, she does have several healing lesions on her hands, and with her history of cellulitis/sepsis, feel that Keflex for 5 days would be a reasonable thing to treat a skin infection.  We can extend the Keflex if  her urine culture comes back positive for a UTI.she will continue the Hibiclens.  Follow-up with PCP.   Discussed labs, MDM, treatment plan, and plan for follow-up with patient. patient agrees with plan.   Meds ordered this encounter  Medications   phenazopyridine (PYRIDIUM) 200 MG tablet    Sig: Take 1 tablet (200 mg total) by mouth 3 (three) times daily as needed for pain.    Dispense:  6 tablet     Refill:  0   DISCONTD: cephALEXin (KEFLEX) 500 MG capsule    Sig: Take 1 capsule (500 mg total) by mouth 4 (four) times daily for 7 days.    Dispense:  28 capsule    Refill:  0   cephALEXin (KEFLEX) 500 MG capsule    Sig: Take 1 capsule (500 mg total) by mouth 4 (four) times daily for 5 days.    Dispense:  20 capsule    Refill:  0   metroNIDAZOLE (FLAGYL) 500 MG tablet    Sig: Take 1 tablet (500 mg total) by mouth 2 (two) times daily for 7 days.    Dispense:  14 tablet    Refill:  0   fluconazole (DIFLUCAN) 150 MG tablet    Sig: Take 1 tablet (150 mg total) by mouth once for 1 dose. 1 tab po x 1. May repeat in 72 hours if no improvement    Dispense:  2 tablet    Refill:  1      *This clinic note was created using [employer]. Therefore, there may be occasional mistakes despite careful proofreading.  ?    Melynda Ripple, MD 02/12/21 [phone]

## 2021-02-13 LAB — CERVICOVAGINAL ANCILLARY ONLY
Chlamydia: NEGATIVE
Comment: NEGATIVE
Comment: NORMAL
Neisseria Gonorrhea: NEGATIVE

## 2021-02-14 LAB — URINE CULTURE

## 2021-03-20 ENCOUNTER — Ambulatory Visit
Admission: EM | Admit: 2021-03-20 | Discharge: 2021-03-20 | Disposition: A | Discharge status: Home / Self Care | Payer: Medicaid Other | Attending: Emergency Medicine | Admitting: Emergency Medicine

## 2021-03-20 ENCOUNTER — Other Ambulatory Visit: Payer: Self-pay

## 2021-03-20 DIAGNOSIS — B370 Candidal stomatitis: Secondary | ICD-10-CM | POA: Diagnosis not present

## 2021-03-20 DIAGNOSIS — B3781 Candidal esophagitis: Secondary | ICD-10-CM | POA: Diagnosis not present

## 2021-03-20 MED ORDER — NYSTATIN 100000 UNIT/ML MT SUSP: 500000.0000 [IU] | Freq: Four times a day (QID) | OROMUCOSAL | 2 refills | Status: AC

## 2021-03-20 NOTE — ED Provider Notes (Signed)
?[facility] ? ? ? ?CSN: [id number] ?Arrival date & time: 03/20/21  1656 ? ? ?  ? ?History   ?Chief Complaint ?Chief Complaint  ?Patient presents with  ? Thrush  ? ? ?HPI ?Tina Barrera is a 31 y.o. female.  ? ?HPI ? ?31 year old female here for evaluation of possible thrush. ? ?Patient reports that she has been experiencing a white coating on her cheeks and tongue and a sore mouth for the last 2 to 3 months.  She states that her tongue and cheeks will burn.  She was recently on antibiotics for treatment of BV and she is a smoker.  Patient also endorses that she will have random nausea and vomiting from time to time. ? ?Past Medical History:  ?Diagnosis Date  ? Anxiety   ? Depression   ? Herpes   ? Staph skin infection   ? ? ?Patient Active Problem List  ? Diagnosis Date Noted  ? Sepsis due to cellulitis ([place]) 12/01/2019  ? Cellulitis and abscess of hand 11/30/2019  ? Intravenous drug abuse ([place]) 11/30/2019  ? Cellulitis of right upper extremity 11/30/2019  ? Cellulitis of left upper extremity 11/30/2019  ? Bipolar 1 disorder ([place]) 11/30/2019  ? Chronic hepatitis C without hepatic coma ([place]) 11/30/2019  ? ? ?Past Surgical History:  ?Procedure Laterality Date  ? NO PAST SURGERIES    ? ? ?OB History   ?No obstetric history on file. ?  ? ? ? ?Home Medications   ? ?Prior to Admission medications   ?Medication Sig Start Date End Date Taking? Authorizing Provider  ?nystatin (MYCOSTATIN) 100000 UNIT/ML suspension Take 5 mLs (500,000 Units total) by mouth 4 (four) times daily. 03/20/21  Yes Margarette Canada, NP  ?acetaminophen (TYLENOL) 325 MG tablet Take 2 tablets (650 mg total) by mouth every 6 (six) hours as needed for mild pain, fever or headache. 12/02/19   Sidney Ace, MD  ?ARIPiprazole (ABILIFY) 30 MG tablet Take 30 mg by mouth daily. 11/09/19   [provider]  ?atomoxetine (STRATTERA) 40 MG capsule Take 40 mg by mouth in the morning and at bedtime.     [provider]   ?Buprenorphine HCl-Naloxone HCl 8-2 MG FILM Place 1 Film under the tongue 3 (three) times daily.     [provider]  ?chlorhexidine (PERIDEX) 0.12 % solution Use as directed 15 mLs in the mouth or throat 2 (two) times daily. 04/08/20   Ward, Delice Bison, DO  ?divalproex (DEPAKOTE ER) 500 MG 24 hr tablet Take 500 mg by mouth in the morning and at bedtime.    [provider]  ?[id number] MG/5ML SYRP Take 1 Dose by mouth as directed.    [provider]  ?FLUoxetine (PROZAC) 20 MG capsule Take 20 mg by mouth daily.     [provider]  ?phenazopyridine (PYRIDIUM) 200 MG tablet Take 1 tablet (200 mg total) by mouth 3 (three) times daily as needed for pain. 02/12/21   Melynda Ripple, MD  ?QUEtiapine (SEROQUEL) 300 MG tablet Take 300 mg by mouth daily.     [provider]  ? ? ?Family History ?Family History  ?Problem Relation Age of Onset  ? Healthy Mother   ? Healthy Father   ? ? ?Social History ?Social History  ? ?Tobacco Use  ? Smoking status: Every Day  ?  Packs/day: 1.00  ?  Types: Cigarettes  ? Smokeless tobacco: Never  ?Vaping Use  ? Vaping Use: Some days  ?Substance  Use Topics  ? Alcohol use: Not Currently  ? Drug use: Yes  ?  Types: Methamphetamines  ?  Comment: A few days ago  ? ? ? ?Allergies   ?Patient has no known allergies. ? ? ?Review of Systems ?Review of Systems  ?Constitutional:  Negative for fever.  ?HENT:  Positive for mouth sores.   ?Gastrointestinal:  Positive for nausea and vomiting.  ? ? ?Physical Exam ?Triage Vital Signs ?ED Triage Vitals  ?Enc Vitals Group  ?   BP 03/20/21 1711 (!) 126/98  ?   Pulse Rate 03/20/21 1711 89  ?   Resp 03/20/21 1711 16  ?   Temp 03/20/21 1711 98.5 ?F (36.9 ?C)  ?   Temp Source 03/20/21 1711 Oral  ?   SpO2 03/20/21 1711 100 %  ?   Weight --   ?   Height --   ?   Head Circumference --   ?   Peak Flow --   ?   Pain Score 03/20/21 1713 0  ?   Pain Loc --   ?   Pain Edu? --   ?   Excl. in [place]? --   ? ?No data  found. ? ?Updated Vital Signs ?BP (!) 126/98 (BP Location: Left Arm)   Pulse 89   Temp 98.5 ?F (36.9 ?C) (Oral)   Resp 16   LMP  (Within Weeks) Comment: 1 week.  SpO2 100%  ? ?Visual Acuity ?Right Eye Distance:   ?Left Eye Distance:   ?Bilateral Distance:   ? ?Right Eye Near:   ?Left Eye Near:    ?Bilateral Near:    ? ?Physical Exam ?Vitals and nursing note reviewed.  ?Constitutional:   ?   Appearance: Normal appearance. She is not ill-appearing.  ?HENT:  ?   Head: Normocephalic and atraumatic.  ?   Mouth/Throat:  ?   Mouth: Mucous membranes are moist.  ?   Pharynx: Oropharyngeal exudate and posterior oropharyngeal erythema present.  ?Skin: ?   General: Skin is warm and dry.  ?   Capillary Refill: Capillary refill takes less than 2 seconds.  ?   Findings: No erythema or rash.  ?Neurological:  ?   General: No focal deficit present.  ?   Mental Status: She is alert and oriented to person, place, and time.  ?Psychiatric:     ?   Mood and Affect: Mood normal.     ?   Behavior: Behavior normal.     ?   Thought Content: Thought content normal.     ?   Judgment: Judgment normal.  ? ? ? ?UC Treatments / Results  ?Labs ?(all labs ordered are listed, but only abnormal results are displayed) ?Labs Reviewed - No data to display ? ?EKG ? ? ?Radiology ?No results found. ? ?Procedures ?Procedures (including critical care time) ? ?Medications Ordered in UC ?Medications - No data to display ? ?Initial Impression / Assessment and Plan / UC Course  ?I have reviewed the triage vital signs and the nursing notes. ? ?Pertinent labs & imaging results that were available during my care of the patient were reviewed by me and considered in my medical decision making (see chart for details). ? ?Patient is a nontoxic-appearing 31 year old female here for evaluation of white coating on her tongue and cheeks that has been present off and on for last 2 to 3 months.  She describes it as burning in nature as well as making her entire mouth  sore.  She has had some intermittent nausea and vomiting also.  She was recently on antibiotics for treatment of BV.  Oropharyngeal exam reveals a white coating on the tongue and buccal surfaces of the cheeks.  There is also erythema in the posterior oropharynx.  No cervical lymphadenopathy appreciable exam.  Patient exam is consistent with oral thrush.  We will treat with nystatin swish and swallow 4 times daily.  I have advised patient to use the nystatin until her lesions have resolved and then for 2-3 more days to help make sure that the yeast has been completely eradicated.  I have also advised her that she should stop smoking as this is not helping her thrush.  I have also cautioned her against eating any concentrated sweets as the sugar will feed her thrush.  If her symptoms not improve she is to see the gastroenterology to make sure this is not a disseminated infection throughout her GI tract. ? ? ?Final Clinical Impressions(s) / UC Diagnoses  ? ?Final diagnoses:  ?Thrush of mouth and esophagus ([place])  ? ? ? ?Discharge Instructions   ? ?  ?Swish and swallow the Nystatin 4 times daly until your mouth lesions resolve and then for 3 more days. ? ?Stop smoking. ? ?Avoid eating concentrated sweets as these will make your thrush worse. ? ? ? ? ?ED Prescriptions   ? ? Medication Sig Dispense Auth. Provider  ? nystatin (MYCOSTATIN) 100000 UNIT/ML suspension Take 5 mLs (500,000 Units total) by mouth 4 (four) times daily. 60 mL Margarette Canada, NP  ? ?  ? ?PDMP not reviewed this encounter. ?  ?Margarette Canada, NP ?03/20/21 1744 ? ?

## 2021-03-20 NOTE — ED Triage Notes (Signed)
Pt reports oral thrush x 2-3 months; on and off skin gets bumps, itching, burns, pus x 6 months. Pt reports she had some medication was helping, but she cannot remember the name. ?

## 2021-03-20 NOTE — Discharge Instructions (Signed)
Swish and swallow the Nystatin 4 times daly until your mouth lesions resolve and then for 3 more days. ? ?Stop smoking. ? ?Avoid eating concentrated sweets as these will make your thrush worse. ?

## 2021-10-23 DIAGNOSIS — Z681 Body mass index (BMI) 19 or less, adult: Secondary | ICD-10-CM | POA: Insufficient documentation

## 2022-05-15 ENCOUNTER — Emergency Department
Admission: EM | Admit: 2022-05-15 | Discharge: 2022-05-15 | Disposition: A | Discharge status: Home / Self Care | Payer: Self-pay | Attending: Emergency Medicine | Admitting: Emergency Medicine

## 2022-05-15 ENCOUNTER — Other Ambulatory Visit: Payer: Self-pay

## 2022-05-15 DIAGNOSIS — T43651A Poisoning by methamphetamines accidental (unintentional), initial encounter: Secondary | ICD-10-CM | POA: Diagnosis present

## 2022-05-15 LAB — COMPREHENSIVE METABOLIC PANEL
ALT: 14 U/L (ref 0–44)
AST: 31 U/L (ref 15–41)
Albumin: 4.3 g/dL (ref 3.5–5.0)
Alkaline Phosphatase: 60 U/L (ref 38–126)
Anion gap: 9 (ref 5–15)
BUN: 11 mg/dL (ref 6–20)
CO2: 24 mmol/L (ref 22–32)
Calcium: 9.2 mg/dL (ref 8.9–10.3)
Chloride: 98 mmol/L (ref 98–111)
Creatinine, Ser: 1.06 mg/dL — ABNORMAL HIGH (ref 0.44–1.00)
GFR, Estimated: >60 mL/min (ref >60)
Glucose, Bld: 96 mg/dL (ref 70–99)
Potassium: 3 mmol/L — ABNORMAL LOW (ref 3.5–5.1)
Sodium: 131 mmol/L — ABNORMAL LOW (ref 135–145)
Total Bilirubin: 0.8 mg/dL (ref 0.3–1.2)
Total Protein: 7.7 g/dL (ref 6.5–8.1)

## 2022-05-15 LAB — CBC WITH DIFFERENTIAL/PLATELET
Abs Immature Granulocytes: 0.06 10*3/uL (ref 0.00–0.07)
Basophils Absolute: 0.1 10*3/uL (ref 0.0–0.1)
Basophils Relative: 0 %
Eosinophils Absolute: 0.2 10*3/uL (ref 0.0–0.5)
Eosinophils Relative: 1 %
HCT: 36.8 % (ref 36.0–46.0)
Hemoglobin: 13 g/dL (ref 12.0–15.0)
Immature Granulocytes: 0 %
Lymphocytes Relative: 14 %
Lymphs Abs: 2.1 10*3/uL (ref 0.7–4.0)
MCH: 30.6 pg (ref 26.0–34.0)
MCHC: 35.3 g/dL (ref 30.0–36.0)
MCV: 86.6 fL (ref 80.0–100.0)
Monocytes Absolute: 0.7 10*3/uL (ref 0.1–1.0)
Monocytes Relative: 5 %
Neutro Abs: 11.6 10*3/uL — ABNORMAL HIGH (ref 1.7–7.7)
Neutrophils Relative %: 80 %
Platelets: 223 10*3/uL (ref 150–400)
RBC: 4.25 MIL/uL (ref 3.87–5.11)
RDW: 11.5 % (ref 11.5–15.5)
WBC: 14.6 10*3/uL — ABNORMAL HIGH (ref 4.0–10.5)
nRBC: 0 % (ref 0.0–0.2)

## 2022-05-15 LAB — ETHANOL: Alcohol, Ethyl (B): <10 mg/dL (ref <10)

## 2022-05-15 LAB — HCG, QUANTITATIVE, PREGNANCY: hCG, Beta Chain, Quant, S: 1 m[IU]/mL (ref <5)

## 2022-05-15 LAB — LIPASE, BLOOD: Lipase: 34 U/L (ref 11–51)

## 2022-05-15 LAB — ACETAMINOPHEN LEVEL: Acetaminophen (Tylenol), Serum: <10 ug/mL — ABNORMAL LOW (ref 10–30)

## 2022-05-15 LAB — SALICYLATE LEVEL: Salicylate Lvl: <7 mg/dL — ABNORMAL LOW (ref 7.0–30.0)

## 2022-05-15 MED ORDER — LORAZEPAM 2 MG/ML IJ SOLN
2.0000 mg | Freq: Once | INTRAMUSCULAR | Status: AC
  Administered 2022-05-15: 2 mg via INTRAVENOUS
  Filled 2022-05-15: qty 1

## 2022-05-15 MED ORDER — SODIUM CHLORIDE 0.9 % IV BOLUS
1000.0000 mL | Freq: Once | INTRAVENOUS | Status: AC
  Administered 2022-05-15: 1000 mL via INTRAVENOUS

## 2022-05-15 MED ORDER — POTASSIUM CHLORIDE CRYS ER 20 MEQ PO TBCR
40.0000 meq | EXTENDED_RELEASE_TABLET | Freq: Once | ORAL | Status: AC
  Administered 2022-05-15: 40 meq via ORAL
  Filled 2022-05-15: qty 2

## 2022-05-15 NOTE — ED Notes (Signed)
Pt given a sprite and sandwich tray

## 2022-05-15 NOTE — ED Triage Notes (Signed)
Pt presents to ER via ems after ingesting appx 1-2 grams of meth appx one hour ago.  Pt extremely restless in wheelchair.  Pt is otherwise A&O x4.  Pt has hx of meth use previously.

## 2022-05-15 NOTE — ED Notes (Signed)
Attempted to contact pt's mother to pick her up from the emergency room per patients request. Mother didn't answer, and unable to leave a message due to voicemail not being set up. Will attempt again.

## 2022-05-15 NOTE — ED Provider Notes (Signed)
[facility] Provider Note    Event Date/Time   First MD Initiated Contact with Patient 05/15/22 0255     (approximate)   History   Drug Overdose   HPI  Tina Barrera is a 32 y.o. female   Past medical history of anxiety, depression, bipolar, chronic hepatitis C, IV drug use, presents to the emergency department with methamphetamine overdose accidental.  She was trying to get high and did not intend to hurt herself and denies suicidality.  No other drug use.  No alcohol use.  She took approximately double her normal dose. External Medical Documents Reviewed: [facility] emergency department visit dated November 2023 for heart palpitations after using methamphetamine      Physical Exam   Triage Vital Signs: ED Triage Vitals  Enc Vitals Group     BP 05/15/22 0251 (!) 161/100     Pulse Rate 05/15/22 0251 (!) 140     Resp 05/15/22 0251 (!) 24     Temp --      Temp src --      SpO2 05/15/22 0251 97 %     Weight --      Height --      Head Circumference --      Peak Flow --      Pain Score 05/15/22 0253 0     Pain Loc --      Pain Edu? --      Excl. in [place]? --     Most recent vital signs: Vitals:   05/15/22 0342 05/15/22 0445  BP:  (!) 142/93  Pulse:    Resp:    Temp: 100.1 F (37.8 C)   SpO2:      General: Awake, no distress.  CV:  Good peripheral perfusion.  Resp:  Normal effort.  Abd:  No distention.  Other:  Awake alert oriented conversant, appears to be intoxicated from stimulant use as reported, fidgeting in bed frequently and tachycardic 140s.   ED Results / Procedures / Treatments   Labs (all labs ordered are listed, but only abnormal results are displayed) Labs Reviewed  ACETAMINOPHEN LEVEL - Abnormal; Notable for the following components:      Result Value   Acetaminophen (Tylenol), Serum <10 (*)    All other components within normal limits  COMPREHENSIVE METABOLIC PANEL - Abnormal; Notable for the following components:    Sodium 131 (*)    Potassium 3.0 (*)    Creatinine, Ser 1.06 (*)    All other components within normal limits  SALICYLATE LEVEL - Abnormal; Notable for the following components:   Salicylate Lvl <7.0 (*)    All other components within normal limits  CBC WITH DIFFERENTIAL/PLATELET - Abnormal; Notable for the following components:   WBC 14.6 (*)    Neutro Abs 11.6 (*)    All other components within normal limits  ETHANOL  LIPASE, BLOOD  HCG, QUANTITATIVE, PREGNANCY  URINE DRUG SCREEN, QUALITATIVE ([facility] ONLY)  POC URINE PREG, ED     I ordered and reviewed the above labs they are notable for mildly elevated white blood cell count of 14 and a negative salicylate and Tylenol level.  Potassium slightly low at 3.0.  Creatinine is 1.06 from 0.7  EKG  ED ECG REPORT I, Pilar Jarvis, the attending physician, personally viewed and interpreted this ECG.   Date: 05/15/2022  EKG Time: 0307  Rate: 133  Rhythm: sinus tachycardia  Axis: nl  Intervals:none  ST&T Change: no stemi  PROCEDURES:  Critical Care performed: Yes, see critical care procedure note(s)  .Critical Care  Performed by: Pilar Jarvis, MD Authorized by: Pilar Jarvis, MD   Critical care provider statement:    Critical care time (minutes):  30   Critical care was necessary to treat or prevent imminent or life-threatening deterioration of the following conditions:  [place]oxid[place]   Critical care was time spent personally by me on the following activities:  Development of treatment plan with patient or surrogate, discussions with consultants, evaluation of patient's response to treatment, examination of patient, ordering and review of laboratory studies, ordering and review of radiographic studies, ordering and performing treatments and interventions, pulse oximetry, re-evaluation of patient's condition and review of old charts    MEDICATIONS ORDERED IN ED: Medications  potassium chloride SA (KLOR-CON M) CR tablet 40 mEq  (has no administration in time range)  LORazepam (ATIVAN) injection 2 mg (2 mg Intravenous Given 05/15/22 0339)  sodium chloride 0.9 % bolus 1,000 mL (0 mLs Intravenous Stopped 05/15/22 0436)  LORazepam (ATIVAN) injection 2 mg (2 mg Intravenous Given 05/15/22 0445)   IMPRESSION / MDM / ASSESSMENT AND PLAN / ED COURSE  I reviewed the triage vital signs and the nursing notes.                                Patient's presentation is most consistent with acute presentation with potential threat to life or bodily function.  Differential diagnosis includes, but is not limited to, stimulant overdose, dysrhythmia, electrolyte disturbances   The patient is on the cardiac monitor to evaluate for evidence of arrhythmia and/or significant heart rate changes.  MDM: Unintentional overdose of methamphetamine stimulant overdose with clinical findings suggestive of the same.  Tachycardic agitated but much better after 2 doses of 2 mg IV Ativan.  Now resting comfortably.  Basic labs and toxicologic labs sent, mild hypokalemia oral repletion is ordered.  Will assess for sobriety and discharge at that time if remainder of labs unremarkable.        FINAL CLINICAL IMPRESSION(S) / ED DIAGNOSES   Final diagnoses:  Overdose of methamphetamine (HCC)     Rx / DC Orders   ED Discharge Orders     None        Note:  This document was prepared using Dragon voice recognition software and may include unintentional dictation errors.    Pilar Jarvis, MD 05/15/22 [phone]

## 2022-05-15 NOTE — ED Notes (Signed)
Spoke with pt's mother and notified her that the patient gave me permission to call her and inform her of the situation, as well as request a ride home. Pt's mother is currently in [place], [place], and is now trying to reach someone closer to the area to come pick the patient up.

## 2022-07-23 ENCOUNTER — Emergency Department: Payer: MEDICAID

## 2022-07-23 ENCOUNTER — Other Ambulatory Visit: Payer: Self-pay

## 2022-07-23 ENCOUNTER — Emergency Department
Admission: EM | Admit: 2022-07-23 | Discharge: 2022-07-23 | Disposition: A | Discharge status: Home / Self Care | Payer: MEDICAID | Attending: Emergency Medicine | Admitting: Emergency Medicine

## 2022-07-23 DIAGNOSIS — R002 Palpitations: Secondary | ICD-10-CM | POA: Diagnosis present

## 2022-07-23 DIAGNOSIS — F1510 Other stimulant abuse, uncomplicated: Secondary | ICD-10-CM | POA: Diagnosis not present

## 2022-07-23 LAB — URINE DRUG SCREEN, QUALITATIVE (ARMC ONLY)
Amphetamines, Ur Screen: POSITIVE — AB
Barbiturates, Ur Screen: NOT DETECTED
Benzodiazepine, Ur Scrn: NOT DETECTED
Cannabinoid 50 Ng, Ur ~~LOC~~: NOT DETECTED
Cocaine Metabolite,Ur ~~LOC~~: NOT DETECTED
MDMA (Ecstasy)Ur Screen: NOT DETECTED
Methadone Scn, Ur: NOT DETECTED
Opiate, Ur Screen: NOT DETECTED
Phencyclidine (PCP) Ur S: NOT DETECTED
Tricyclic, Ur Screen: NOT DETECTED

## 2022-07-23 LAB — URINALYSIS, ROUTINE W REFLEX MICROSCOPIC
Bilirubin Urine: NEGATIVE
Glucose, UA: NEGATIVE mg/dL
Hgb urine dipstick: NEGATIVE
Ketones, ur: NEGATIVE mg/dL
Leukocytes,Ua: NEGATIVE
Nitrite: POSITIVE — AB
Protein, ur: NEGATIVE mg/dL
Specific Gravity, Urine: 1.006 (ref 1.005–1.030)
pH: 7 (ref 5.0–8.0)

## 2022-07-23 LAB — CBC
HCT: 32.5 % — ABNORMAL LOW (ref 36.0–46.0)
Hemoglobin: 11.8 g/dL — ABNORMAL LOW (ref 12.0–15.0)
MCH: 30.4 pg (ref 26.0–34.0)
MCHC: 36.3 g/dL — ABNORMAL HIGH (ref 30.0–36.0)
MCV: 83.8 fL (ref 80.0–100.0)
Platelets: 168 10*3/uL (ref 150–400)
RBC: 3.88 MIL/uL (ref 3.87–5.11)
RDW: 12.1 % (ref 11.5–15.5)
WBC: 11.7 10*3/uL — ABNORMAL HIGH (ref 4.0–10.5)
nRBC: 0 % (ref 0.0–0.2)

## 2022-07-23 LAB — COMPREHENSIVE METABOLIC PANEL
ALT: 16 U/L (ref 0–44)
AST: 22 U/L (ref 15–41)
Albumin: 3.9 g/dL (ref 3.5–5.0)
Alkaline Phosphatase: 57 U/L (ref 38–126)
Anion gap: 10 (ref 5–15)
BUN: 11 mg/dL (ref 6–20)
CO2: 17 mmol/L — ABNORMAL LOW (ref 22–32)
Calcium: 8.6 mg/dL — ABNORMAL LOW (ref 8.9–10.3)
Chloride: 108 mmol/L (ref 98–111)
Creatinine, Ser: 0.88 mg/dL (ref 0.44–1.00)
GFR, Estimated: >60 mL/min (ref >60)
Glucose, Bld: 80 mg/dL (ref 70–99)
Potassium: 4 mmol/L (ref 3.5–5.1)
Sodium: 135 mmol/L (ref 135–145)
Total Bilirubin: 0.5 mg/dL (ref 0.3–1.2)
Total Protein: 7.1 g/dL (ref 6.5–8.1)

## 2022-07-23 LAB — TROPONIN I (HIGH SENSITIVITY): Troponin I (High Sensitivity): 4 ng/L (ref <18)

## 2022-07-23 LAB — TSH: TSH: 2.974 u[IU]/mL (ref 0.350–4.500)

## 2022-07-23 LAB — T4, FREE: Free T4: 0.8 ng/dL (ref 0.61–1.12)

## 2022-07-23 MED ORDER — LORAZEPAM 2 MG/ML IJ SOLN
1.0000 mg | Freq: Once | INTRAMUSCULAR | Status: AC
  Administered 2022-07-23: 1 mg via INTRAVENOUS
  Filled 2022-07-23: qty 1

## 2022-07-23 MED ORDER — LORAZEPAM 2 MG/ML IJ SOLN
0.5000 mg | Freq: Once | INTRAMUSCULAR | Status: AC
  Administered 2022-07-23: 0.5 mg via INTRAVENOUS

## 2022-07-23 MED ORDER — FOSFOMYCIN TROMETHAMINE 3 G PO PACK
3.0000 g | PACK | Freq: Once | ORAL | Status: AC
  Administered 2022-07-23: 3 g via ORAL
  Filled 2022-07-23: qty 3

## 2022-07-23 MED ORDER — SODIUM CHLORIDE 0.9 % IV BOLUS
1000.0000 mL | Freq: Once | INTRAVENOUS | Status: AC
  Administered 2022-07-23: 1000 mL via INTRAVENOUS

## 2022-07-23 MED ORDER — LORAZEPAM 2 MG/ML IJ SOLN: 2.0000 mg | Freq: Once | INTRAMUSCULAR | Status: DC

## 2022-07-23 MED ORDER — METOPROLOL TARTRATE 5 MG/5ML IV SOLN
2.5000 mg | Freq: Once | INTRAVENOUS | Status: AC
  Administered 2022-07-23: 2.5 mg via INTRAVENOUS
  Filled 2022-07-23: qty 5

## 2022-07-23 MED ORDER — LORAZEPAM 2 MG/ML IJ SOLN
1.0000 mg | Freq: Once | INTRAMUSCULAR | Status: DC
  Filled 2022-07-23: qty 1

## 2022-07-23 NOTE — ED Triage Notes (Signed)
Pt reports mild cp and sensation of palpitations. Reports hx of same after meth use and that pt ingested meth a couple hours ago. HR 148 in triage and EKG being obtained. Reports slight associated SOB. Denies h/a, dizziness, vision change, parasthesia, n/v. Pt alert and oriented, calm and cooperative. Breathing unlabored speaking in full sentences with symmetric chest rise and fall.

## 2022-07-23 NOTE — ED Provider Notes (Signed)
[facility] Provider Note    Event Date/Time   First MD Initiated Contact with Patient 07/23/22 0139     (approximate)   History   Palpitations (/)   HPI  Tina Barrera is a 32 y.o. female who presents to the ED from home with a chief complaint of chest pain and palpitations.  History of same after methamphetamine use.  Patient reports she snorted methamphetamine 2 hours prior to arrival.  Denies headache, vision change, abdominal pain, nausea, vomiting or dizziness.  Endorses slight shortness of breath.  Denies numbness/tingling.     Past Medical History   Past Medical History:  Diagnosis Date   Anxiety    Depression    Herpes    Staph skin infection      Active Problem List   Patient Active Problem List   Diagnosis Date Noted   Sepsis due to cellulitis (HCC) 12/01/2019   Cellulitis and abscess of hand 11/30/2019   Intravenous drug abuse (HCC) 11/30/2019   Cellulitis of right upper extremity 11/30/2019   Cellulitis of left upper extremity 11/30/2019   Bipolar 1 disorder (HCC) 11/30/2019   Chronic hepatitis C without hepatic coma (HCC) 11/30/2019     Past Surgical History   Past Surgical History:  Procedure Laterality Date   NO PAST SURGERIES       Home Medications   Prior to Admission medications   Medication Sig Start Date End Date Taking? Authorizing Provider  acetaminophen (TYLENOL) 325 MG tablet Take 2 tablets (650 mg total) by mouth every 6 (six) hours as needed for mild pain, fever or headache. 12/02/19   Lolita Patella B, MD  ARIPiprazole (ABILIFY) 30 MG tablet Take 30 mg by mouth daily. 11/09/19   [provider]  atomoxetine (STRATTERA) 40 MG capsule Take 40 mg by mouth in the morning and at bedtime.     [provider]  Buprenorphine HCl-Naloxone HCl 8-2 MG FILM Place 1 Film under the tongue 3 (three) times daily.     [provider]  chlorhexidine (PERIDEX) 0.12 % solution Use as  directed 15 mLs in the mouth or throat 2 (two) times daily. 04/08/20   Ward, Layla Maw, DO  divalproex (DEPAKOTE ER) 500 MG 24 hr tablet Take 500 mg by mouth in the morning and at bedtime.    [provider]  Elderberry 575 MG/5ML SYRP Take 1 Dose by mouth as directed.    [provider]  FLUoxetine (PROZAC) 20 MG capsule Take 20 mg by mouth daily.     [provider]  nystatin (MYCOSTATIN) 100000 UNIT/ML suspension Take 5 mLs (500,000 Units total) by mouth 4 (four) times daily. 03/20/21   Becky Augusta, NP  phenazopyridine (PYRIDIUM) 200 MG tablet Take 1 tablet (200 mg total) by mouth 3 (three) times daily as needed for pain. 02/12/21   Domenick Gong, MD  QUEtiapine (SEROQUEL) 300 MG tablet Take 300 mg by mouth daily.     [provider]     Allergies  Patient has no known allergies.   Family History   Family History  Problem Relation Age of Onset   Healthy Mother    Healthy Father      Physical Exam  Triage Vital Signs: ED Triage Vitals  Encounter Vitals Group     BP 07/23/22 0123 (!) 167/121     Systolic BP Percentile --      Diastolic BP Percentile --      Pulse Rate 07/23/22  0123 (!) 148     Resp 07/23/22 0123 20     Temp 07/23/22 0123 98.8 F (37.1 C)     Temp Source 07/23/22 0123 Oral     SpO2 07/23/22 0123 100 %     Weight 07/23/22 0124 135 lb (61.2 kg)     Height 07/23/22 0124 5\' 5"  (1.651 m)     Head Circumference --      Peak Flow --      Pain Score 07/23/22 0127 3     Pain Loc --      Pain Education --      Exclude from Growth Chart --     Updated Vital Signs: BP (!) 132/97   Pulse (!) 125   Temp 98.8 F (37.1 C) (Oral)   Resp 18   Ht 5\' 5"  (1.651 m)   Wt 61.2 kg   SpO2 100%   BMI 22.47 kg/m    General: Awake, mild distress.  CV:  Tachycardic.  Good peripheral perfusion.  Resp:  Normal effort.  CTAB. Abd:  Nontender.  No distention.  Other:  No carotid bruits.  Alert and oriented x 3.  CN II-XII grossly  intact.  5/5 motor strength and sensation all extremities.  MAE x 4.   ED Results / Procedures / Treatments  Labs (all labs ordered are listed, but only abnormal results are displayed) Labs Reviewed  COMPREHENSIVE METABOLIC PANEL - Abnormal; Notable for the following components:      Result Value   CO2 17 (*)    Calcium 8.6 (*)    All other components within normal limits  URINALYSIS, ROUTINE W REFLEX MICROSCOPIC - Abnormal; Notable for the following components:   Color, Urine STRAW (*)    APPearance HAZY (*)    Nitrite POSITIVE (*)    Bacteria, UA RARE (*)    All other components within normal limits  URINE DRUG SCREEN, QUALITATIVE ([facility] ONLY) - Abnormal; Notable for the following components:   Amphetamines, Ur Screen POSITIVE (*)    All other components within normal limits  CBC - Abnormal; Notable for the following components:   WBC 11.7 (*)    Hemoglobin 11.8 (*)    HCT 32.5 (*)    MCHC 36.3 (*)    All other components within normal limits  T4, FREE  TSH  PREGNANCY, URINE  POC URINE PREG, ED  TROPONIN I (HIGH SENSITIVITY)     EKG  ED ECG REPORT I, Aristides Luckey J, the attending physician, personally viewed and interpreted this ECG.   Date: 07/23/2022  EKG Time: 0128  Rate: 147  Rhythm: sinus tachycardia  Axis: RAD  Intervals: PVCs  ST&T Change: Nonspecific    RADIOLOGY I have independently visualized and interpreted patient's chest x-ray as well as noted the radiology interpretation:  Chest x-ray: No acute cardiopulmonary process  Official radiology report(s): DG Chest 2 View  Result Date: 07/23/2022 CLINICAL DATA:  Tachycardia with hypertension EXAM: CHEST - 2 VIEW COMPARISON:  Chest x-ray 11/30/2019 FINDINGS: The heart size and mediastinal contours are within normal limits. Both lungs are clear. The visualized skeletal structures are unremarkable. IMPRESSION: No active cardiopulmonary disease. Electronically Signed   By: Darliss Cheney M.D.   On: 07/23/2022  01:46     PROCEDURES:  Critical Care performed: Yes, see critical care procedure note(s)  CRITICAL CARE Performed by: Irean Hong   Total critical care time: 30 minutes  Critical care time was exclusive of separately billable procedures and treating other  patients.  Critical care was necessary to treat or prevent imminent or life-threatening deterioration.  Critical care was time spent personally by me on the following activities: development of treatment plan with patient and/or surrogate as well as nursing, discussions with consultants, evaluation of patient's response to treatment, examination of patient, obtaining history from patient or surrogate, ordering and performing treatments and interventions, ordering and review of laboratory studies, ordering and review of radiographic studies, pulse oximetry and re-evaluation of patient's condition.   Marland Kitchen1-3 Lead EKG Interpretation  Performed by: Irean Hong, MD Authorized by: Irean Hong, MD     Interpretation: abnormal     ECG rate:  135   ECG rate assessment: tachycardic     Rhythm: sinus tachycardia     Ectopy: none     Conduction: normal   Comments:     Patient placed on cardiac monitor to evaluate for arrhythmias    MEDICATIONS ORDERED IN ED: Medications  sodium chloride 0.9 % bolus 1,000 mL (0 mLs Intravenous Stopped 07/23/22 0330)  LORazepam (ATIVAN) injection 0.5 mg (0.5 mg Intravenous Given 07/23/22 0247)  sodium chloride 0.9 % bolus 1,000 mL (0 mLs Intravenous Stopped 07/23/22 0530)  LORazepam (ATIVAN) injection 1 mg (1 mg Intravenous Given 07/23/22 0433)  fosfomycin (MONUROL) packet 3 g (3 g Oral Given 07/23/22 0432)  sodium chloride 0.9 % bolus 1,000 mL (1,000 mLs Intravenous New Bag/Given 07/23/22 0645)  LORazepam (ATIVAN) injection 1 mg (1 mg Intravenous Given 07/23/22 0645)     IMPRESSION / MDM / ASSESSMENT AND PLAN / ED COURSE  I reviewed the triage vital signs and the nursing notes.                              32 year old female presenting with palpitations and chest pain after snorting methamphetamine. Differential diagnosis includes, but is not limited to, ACS, aortic dissection, pulmonary embolism, cardiac tamponade, pneumothorax, pneumonia, pericarditis, myocarditis, GI-related causes including esophagitis/gastritis, and musculoskeletal chest wall pain.   Personally reviewed patient's records and note a PCP office visit on 06/03/2022 for acute pharyngitis; she had a similar ED presentation on 05/15/2022 for methamphetamine overdose.  Patient's presentation is most consistent with acute presentation with potential threat to life or bodily function.  The patient is on the cardiac monitor to evaluate for evidence of arrhythmia and/or significant heart rate changes.  Will obtain cardiac and thyroid panel, chest x-ray.  Administer IV fluids, IV Ativan.  Will continue to monitor and care for patient.  Clinical Course as of 07/23/22 1610  Caleen Essex Jul 23, 2022  9604 Patient is a difficult IV start secondary to sequela of drug abuse.  Will consult IV team.  In the interim, will administer IM Ativan. [JS]  0417 Heart rate improved to 120s, blood pressure improved.  Will administer additional fluids and IV Ativan.  Unable to obtain lab work; awaiting phlebotomy team. [JS]  0510 HR 116; patient resting in no acute distress [JS]  0616 Patient resting in no acute distress.  Will administer third liter IV fluids, additional IV Ativan.  Awaiting rest of blood work. [JS]  [id number] Care transferred to Dr. Lenard Lance at change of shift pending reassessment. [JS]    Clinical Course User Index [JS] Irean Hong, MD     FINAL CLINICAL IMPRESSION(S) / ED DIAGNOSES   Final diagnoses:  Palpitations  Methamphetamine abuse (HCC)     Rx / DC Orders   ED Discharge Orders  None        Note:  This document was prepared using Dragon voice recognition software and may include unintentional dictation errors.    Irean Hong, MD 07/23/22 [phone]

## 2022-07-23 NOTE — ED Provider Notes (Signed)
-----------------------------------------   9:16 AM on 07/23/2022 ----------------------------------------- Patient care assumed from Dr. Dolores Frame.  Patient is lab work shows a reassuring CBC negative troponin normal thyroid studies urinalysis is nitrite positive the patient did receive fosfomycin overnight.  Patient's chemistry is reassuring patient's urine drug screen is positive for amphetamines patient does admit to using methamphetamines around 11 PM or so she believes last night.  Patient remains tachycardic currently around 140 bpm but she is quite agitated.  We will dose 1 mg of IV Ativan and reassess.  Patient has finished 2 L bolus.  Patient states last time she used methamphetamine a similar event happened but not to this degree per patient.  She is awake alert she is talking answering questions appropriately.  Pulse rate remains elevated we will dose a small dose 2.5 mg of metoprolol.  ----------------------------------------- 1:40 PM on 07/23/2022 ----------------------------------------- Patient is awake alert she has eaten.  Acting appropriately.  Patient's pulse rate has normalized down to the 90s.  He has maintained this level for approximately an hour and 20 minutes at this point.  Patient is feeling better states she is ready to go home.  I believe discharge home is appropriate with PCP follow-up.  Highly suspect patient's palpitations and tachycardia to be related to her recent methamphetamine use.  Patient is now been in the emergency department over 12 hours.   Minna Antis, MD 07/23/22 1341

## 2022-07-23 NOTE — ED Notes (Addendum)
IV placed unable to draw back for labs- labs still need to be obtained- IV team consult left in orders to obtain additional access with lab draw

## 2022-07-23 NOTE — ED Notes (Signed)
Went over d/c paperwork at this time with patient. Pt had no questions, comments or concerns after review and verbally understood them.  

## 2022-08-24 ENCOUNTER — Inpatient Hospital Stay
Admission: RE | Admit: 2022-08-24 | Discharge: 2022-08-24 | Disposition: A | Discharge status: Home / Self Care | Payer: Self-pay | Source: Ambulatory Visit | Attending: Internal Medicine | Admitting: Internal Medicine

## 2022-08-24 ENCOUNTER — Other Ambulatory Visit: Payer: Self-pay | Admitting: *Deleted

## 2022-08-24 ENCOUNTER — Other Ambulatory Visit: Payer: Self-pay

## 2022-08-24 DIAGNOSIS — Z1231 Encounter for screening mammogram for malignant neoplasm of breast: Secondary | ICD-10-CM

## 2022-08-24 NOTE — Addendum Note (Signed)
Addended by: Narda Rutherford on: 08/24/2022 10:05 AM   Modules accepted: Orders

## 2022-08-26 ENCOUNTER — Encounter: Payer: Self-pay | Admitting: Internal Medicine

## 2022-09-06 ENCOUNTER — Ambulatory Visit: Payer: Medicaid Other

## 2022-12-16 DIAGNOSIS — G250 Essential tremor: Secondary | ICD-10-CM | POA: Insufficient documentation

## 2022-12-16 DIAGNOSIS — R000 Tachycardia, unspecified: Secondary | ICD-10-CM | POA: Insufficient documentation

## 2023-05-14 ENCOUNTER — Inpatient Hospital Stay
Admission: EM | Admit: 2023-05-14 | Discharge: 2023-05-16 | DRG: 194 | Disposition: A | Discharge status: Home / Self Care | Payer: MEDICAID | Attending: Family Medicine | Admitting: Family Medicine

## 2023-05-14 ENCOUNTER — Other Ambulatory Visit: Payer: Self-pay

## 2023-05-14 ENCOUNTER — Emergency Department: Payer: MEDICAID

## 2023-05-14 DIAGNOSIS — F1210 Cannabis abuse, uncomplicated: Secondary | ICD-10-CM | POA: Diagnosis present

## 2023-05-14 DIAGNOSIS — F17210 Nicotine dependence, cigarettes, uncomplicated: Secondary | ICD-10-CM | POA: Diagnosis present

## 2023-05-14 DIAGNOSIS — E876 Hypokalemia: Secondary | ICD-10-CM | POA: Insufficient documentation

## 2023-05-14 DIAGNOSIS — F419 Anxiety disorder, unspecified: Secondary | ICD-10-CM | POA: Diagnosis present

## 2023-05-14 DIAGNOSIS — R0602 Shortness of breath: Principal | ICD-10-CM

## 2023-05-14 DIAGNOSIS — Z79899 Other long term (current) drug therapy: Secondary | ICD-10-CM

## 2023-05-14 DIAGNOSIS — R000 Tachycardia, unspecified: Secondary | ICD-10-CM | POA: Diagnosis present

## 2023-05-14 DIAGNOSIS — Z1152 Encounter for screening for COVID-19: Secondary | ICD-10-CM

## 2023-05-14 DIAGNOSIS — I10 Essential (primary) hypertension: Secondary | ICD-10-CM | POA: Insufficient documentation

## 2023-05-14 DIAGNOSIS — Z7951 Long term (current) use of inhaled steroids: Secondary | ICD-10-CM | POA: Diagnosis not present

## 2023-05-14 DIAGNOSIS — F1120 Opioid dependence, uncomplicated: Secondary | ICD-10-CM | POA: Diagnosis present

## 2023-05-14 DIAGNOSIS — J189 Pneumonia, unspecified organism: Principal | ICD-10-CM | POA: Diagnosis present

## 2023-05-14 DIAGNOSIS — F1410 Cocaine abuse, uncomplicated: Secondary | ICD-10-CM | POA: Diagnosis present

## 2023-05-14 DIAGNOSIS — F1510 Other stimulant abuse, uncomplicated: Secondary | ICD-10-CM | POA: Diagnosis present

## 2023-05-14 DIAGNOSIS — F319 Bipolar disorder, unspecified: Secondary | ICD-10-CM | POA: Diagnosis present

## 2023-05-14 DIAGNOSIS — F1910 Other psychoactive substance abuse, uncomplicated: Secondary | ICD-10-CM | POA: Diagnosis present

## 2023-05-14 DIAGNOSIS — R350 Frequency of micturition: Secondary | ICD-10-CM | POA: Diagnosis present

## 2023-05-14 DIAGNOSIS — F1520 Other stimulant dependence, uncomplicated: Secondary | ICD-10-CM | POA: Diagnosis present

## 2023-05-14 LAB — COMPREHENSIVE METABOLIC PANEL WITH GFR
ALT: 16 U/L (ref 0–44)
AST: 16 U/L (ref 15–41)
Albumin: 3.5 g/dL (ref 3.5–5.0)
Alkaline Phosphatase: 76 U/L (ref 38–126)
Anion gap: 13 (ref 5–15)
BUN: 8 mg/dL (ref 6–20)
CO2: 20 mmol/L — ABNORMAL LOW (ref 22–32)
Calcium: 9.1 mg/dL (ref 8.9–10.3)
Chloride: 101 mmol/L (ref 98–111)
Creatinine, Ser: 0.92 mg/dL (ref 0.44–1.00)
GFR, Estimated: >60 mL/min (ref >60)
Glucose, Bld: 134 mg/dL — ABNORMAL HIGH (ref 70–99)
Potassium: 3.2 mmol/L — ABNORMAL LOW (ref 3.5–5.1)
Sodium: 134 mmol/L — ABNORMAL LOW (ref 135–145)
Total Bilirubin: 0.8 mg/dL (ref 0.0–1.2)
Total Protein: 7.7 g/dL (ref 6.5–8.1)

## 2023-05-14 LAB — CBC WITH DIFFERENTIAL/PLATELET
Abs Immature Granulocytes: 0.09 10*3/uL — ABNORMAL HIGH (ref 0.00–0.07)
Basophils Absolute: 0 10*3/uL (ref 0.0–0.1)
Basophils Relative: 0 %
Eosinophils Absolute: 0 10*3/uL (ref 0.0–0.5)
Eosinophils Relative: 0 %
HCT: 31.8 % — ABNORMAL LOW (ref 36.0–46.0)
Hemoglobin: 10.9 g/dL — ABNORMAL LOW (ref 12.0–15.0)
Immature Granulocytes: 1 %
Lymphocytes Relative: 12 %
Lymphs Abs: 1.8 10*3/uL (ref 0.7–4.0)
MCH: 28.2 pg (ref 26.0–34.0)
MCHC: 34.3 g/dL (ref 30.0–36.0)
MCV: 82.2 fL (ref 80.0–100.0)
Monocytes Absolute: 0.6 10*3/uL (ref 0.1–1.0)
Monocytes Relative: 4 %
Neutro Abs: 11.9 10*3/uL — ABNORMAL HIGH (ref 1.7–7.7)
Neutrophils Relative %: 83 %
Platelets: 318 10*3/uL (ref 150–400)
RBC: 3.87 MIL/uL (ref 3.87–5.11)
RDW: 13.2 % (ref 11.5–15.5)
WBC: 14.4 10*3/uL — ABNORMAL HIGH (ref 4.0–10.5)
nRBC: 0 % (ref 0.0–0.2)

## 2023-05-14 LAB — URINE DRUG SCREEN, QUALITATIVE (ARMC ONLY)
Amphetamines, Ur Screen: POSITIVE — AB
Barbiturates, Ur Screen: NOT DETECTED
Benzodiazepine, Ur Scrn: NOT DETECTED
Cannabinoid 50 Ng, Ur ~~LOC~~: POSITIVE — AB
Cocaine Metabolite,Ur ~~LOC~~: NOT DETECTED
MDMA (Ecstasy)Ur Screen: NOT DETECTED
Methadone Scn, Ur: NOT DETECTED
Opiate, Ur Screen: NOT DETECTED
Phencyclidine (PCP) Ur S: NOT DETECTED
Tricyclic, Ur Screen: POSITIVE — AB

## 2023-05-14 LAB — BRAIN NATRIURETIC PEPTIDE: B Natriuretic Peptide: 17.6 pg/mL (ref 0.0–100.0)

## 2023-05-14 LAB — MAGNESIUM: Magnesium: 1.7 mg/dL (ref 1.7–2.4)

## 2023-05-14 LAB — RESP PANEL BY RT-PCR (RSV, FLU A&B, COVID)  RVPGX2
Influenza A by PCR: NEGATIVE
Influenza B by PCR: NEGATIVE
Resp Syncytial Virus by PCR: NEGATIVE
SARS Coronavirus 2 by RT PCR: NEGATIVE

## 2023-05-14 LAB — LACTIC ACID, PLASMA: Lactic Acid, Venous: 0.8 mmol/L (ref 0.5–1.9)

## 2023-05-14 LAB — MRSA NEXT GEN BY PCR, NASAL: MRSA by PCR Next Gen: NOT DETECTED

## 2023-05-14 LAB — TROPONIN I (HIGH SENSITIVITY): Troponin I (High Sensitivity): 3 ng/L (ref <18)

## 2023-05-14 MED ORDER — ONDANSETRON HCL 4 MG/2ML IJ SOLN: 4.0000 mg | Freq: Three times a day (TID) | INTRAMUSCULAR | Status: DC | PRN

## 2023-05-14 MED ORDER — VANCOMYCIN HCL IN DEXTROSE 1-5 GM/200ML-% IV SOLN
1000.0000 mg | Freq: Two times a day (BID) | INTRAVENOUS | Status: DC
  Administered 2023-05-15 (×2): 1000 mg via INTRAVENOUS
  Filled 2023-05-14 (×2): qty 200

## 2023-05-14 MED ORDER — SODIUM CHLORIDE 0.9 % IV SOLN
2.0000 g | Freq: Once | INTRAVENOUS | Status: AC
  Administered 2023-05-14: 2 g via INTRAVENOUS
  Filled 2023-05-14: qty 12.5

## 2023-05-14 MED ORDER — VANCOMYCIN HCL IN DEXTROSE 1-5 GM/200ML-% IV SOLN
1000.0000 mg | Freq: Once | INTRAVENOUS | Status: AC
  Administered 2023-05-14: 1000 mg via INTRAVENOUS
  Filled 2023-05-14: qty 200

## 2023-05-14 MED ORDER — SODIUM CHLORIDE 0.9 % IV SOLN
2.0000 g | Freq: Three times a day (TID) | INTRAVENOUS | Status: DC
  Administered 2023-05-14 – 2023-05-16 (×7): 2 g via INTRAVENOUS
  Filled 2023-05-14 (×8): qty 12.5

## 2023-05-14 MED ORDER — LORAZEPAM 2 MG/ML IJ SOLN
0.5000 mg | Freq: Once | INTRAMUSCULAR | Status: AC
  Administered 2023-05-14: 0.5 mg via INTRAVENOUS
  Filled 2023-05-14: qty 1

## 2023-05-14 MED ORDER — ENOXAPARIN SODIUM 40 MG/0.4ML IJ SOSY
40.0000 mg | PREFILLED_SYRINGE | INTRAMUSCULAR | Status: DC
  Administered 2023-05-14 – 2023-05-15 (×2): 40 mg via SUBCUTANEOUS
  Filled 2023-05-14 (×2): qty 0.4

## 2023-05-14 MED ORDER — VARENICLINE TARTRATE 0.5 MG PO TABS
1.0000 mg | ORAL_TABLET | Freq: Two times a day (BID) | ORAL | Status: DC
Start: 2023-05-14 — End: 2023-05-16
  Administered 2023-05-14 – 2023-05-16 (×5): 1 mg via ORAL
  Filled 2023-05-14 (×7): qty 2

## 2023-05-14 MED ORDER — PRAZOSIN HCL 1 MG PO CAPS
1.0000 mg | ORAL_CAPSULE | Freq: Every day | ORAL | Status: DC
  Administered 2023-05-14 – 2023-05-15 (×2): 1 mg via ORAL
  Filled 2023-05-14 (×3): qty 1

## 2023-05-14 MED ORDER — METHYLPREDNISOLONE SODIUM SUCC 125 MG IJ SOLR
125.0000 mg | Freq: Once | INTRAMUSCULAR | Status: AC
Start: 2023-05-14 — End: 2023-05-14
  Administered 2023-05-14: 125 mg via INTRAVENOUS
  Filled 2023-05-14: qty 2

## 2023-05-14 MED ORDER — IPRATROPIUM-ALBUTEROL 0.5-2.5 (3) MG/3ML IN SOLN
3.0000 mL | Freq: Once | RESPIRATORY_TRACT | Status: AC
  Administered 2023-05-14: 3 mL via RESPIRATORY_TRACT
  Filled 2023-05-14: qty 3

## 2023-05-14 MED ORDER — ACETAMINOPHEN 325 MG PO TABS
650.0000 mg | ORAL_TABLET | Freq: Four times a day (QID) | ORAL | Status: DC | PRN
Start: 2023-05-14 — End: 2023-05-16

## 2023-05-14 MED ORDER — POTASSIUM CHLORIDE CRYS ER 20 MEQ PO TBCR
20.0000 meq | EXTENDED_RELEASE_TABLET | Freq: Once | ORAL | Status: AC
  Administered 2023-05-14: 20 meq via ORAL
  Filled 2023-05-14: qty 1

## 2023-05-14 MED ORDER — METOPROLOL SUCCINATE ER 50 MG PO TB24
50.0000 mg | ORAL_TABLET | Freq: Every day | ORAL | Status: DC
  Administered 2023-05-14 – 2023-05-15 (×2): 50 mg via ORAL
  Filled 2023-05-14 (×2): qty 1

## 2023-05-14 MED ORDER — POTASSIUM CHLORIDE 10 MEQ/100ML IV SOLN
10.0000 meq | Freq: Once | INTRAVENOUS | Status: AC
  Administered 2023-05-14: 10 meq via INTRAVENOUS
  Filled 2023-05-14: qty 100

## 2023-05-14 MED ORDER — BENZONATATE 100 MG PO CAPS: 200.0000 mg | ORAL_CAPSULE | Freq: Three times a day (TID) | ORAL | Status: DC | PRN

## 2023-05-14 MED ORDER — NICOTINE POLACRILEX 2 MG MT GUM: 2.0000 mg | CHEWING_GUM | OROMUCOSAL | Status: DC | PRN

## 2023-05-14 MED ORDER — HYDROXYZINE HCL 50 MG PO TABS: 50.0000 mg | ORAL_TABLET | Freq: Two times a day (BID) | ORAL | Status: DC | PRN

## 2023-05-14 MED ORDER — BUPRENORPHINE HCL-NALOXONE HCL 8-2 MG SL SUBL
1.0000 | SUBLINGUAL_TABLET | Freq: Two times a day (BID) | SUBLINGUAL | Status: DC
  Administered 2023-05-14 – 2023-05-16 (×5): 1 via SUBLINGUAL
  Filled 2023-05-14 (×5): qty 1

## 2023-05-14 MED ORDER — ALBUTEROL SULFATE (2.5 MG/3ML) 0.083% IN NEBU
2.5000 mg | INHALATION_SOLUTION | RESPIRATORY_TRACT | Status: DC | PRN
Start: 2023-05-14 — End: 2023-05-16

## 2023-05-14 MED ORDER — FLUOXETINE HCL 20 MG PO CAPS
20.0000 mg | ORAL_CAPSULE | Freq: Every day | ORAL | Status: DC
  Administered 2023-05-14 – 2023-05-16 (×3): 20 mg via ORAL
  Filled 2023-05-14 (×3): qty 1

## 2023-05-14 MED ORDER — POLYETHYLENE GLYCOL 3350 17 G PO PACK: 17.0000 g | PACK | Freq: Every day | ORAL | Status: DC | PRN

## 2023-05-14 MED ORDER — GUAIFENESIN ER 600 MG PO TB12
600.0000 mg | ORAL_TABLET | Freq: Two times a day (BID) | ORAL | Status: DC
  Administered 2023-05-14 – 2023-05-16 (×5): 600 mg via ORAL
  Filled 2023-05-14 (×5): qty 1

## 2023-05-14 MED ORDER — VANCOMYCIN HCL IN DEXTROSE 1-5 GM/200ML-% IV SOLN: 1000.0000 mg | Freq: Once | INTRAVENOUS | Status: DC

## 2023-05-14 MED ORDER — LURASIDONE HCL 40 MG PO TABS
40.0000 mg | ORAL_TABLET | Freq: Every day | ORAL | Status: DC
  Administered 2023-05-15: 40 mg via ORAL
  Filled 2023-05-14 (×3): qty 1

## 2023-05-14 MED ORDER — NICOTINE 14 MG/24HR TD PT24
14.0000 mg | MEDICATED_PATCH | Freq: Every day | TRANSDERMAL | Status: DC
  Administered 2023-05-14 – 2023-05-16 (×3): 14 mg via TRANSDERMAL
  Filled 2023-05-14 (×3): qty 1

## 2023-05-14 MED ORDER — AMITRIPTYLINE HCL 25 MG PO TABS
75.0000 mg | ORAL_TABLET | Freq: Every day | ORAL | Status: DC
  Administered 2023-05-14 – 2023-05-15 (×2): 75 mg via ORAL
  Filled 2023-05-14 (×2): qty 3

## 2023-05-14 MED ORDER — MELATONIN 5 MG PO TABS: 5.0000 mg | ORAL_TABLET | Freq: Every evening | ORAL | Status: DC | PRN

## 2023-05-14 MED ORDER — SODIUM CHLORIDE 0.9 % IV BOLUS
1000.0000 mL | Freq: Once | INTRAVENOUS | Status: AC
  Administered 2023-05-14: 1000 mL via INTRAVENOUS

## 2023-05-14 NOTE — ED Triage Notes (Signed)
 Pt was recently d/c from hospital after admission for bilateral pneumonia. Pt has been taking her medications after her d/c a few a weeks ago. Pt endorses smoking cigarettes & snorting ice in the past 24 hours.

## 2023-05-14 NOTE — Hospital Course (Signed)
 Tina Barrera is a 33 y.o. female with medical history significant of IV drug use, anxiety, bipolar disorder, depression.  She presents to [facility] ED with reports of dyspnea, of note she was recently hospitalized approximately 1 month ago for pneumonia and has been compliant with medications at discharge.  Reports dyspnea worsened 1 to 2 days ago, endorses productive cough with white/gray phlegm.  Denies fevers, chills, chest pain, palpitations, or dizziness.  She was seen by her PCP 4/30 and x-ray at that time showed right lower lobe pneumonia and she was subsequently started on doxycycline outpatient for which she reports compliance.  She endorses tobacco, methamphetamine, and marijuana use.  Reports that she snorts methamphetamine and infrequently injects it.  She smokes marijuana and vapes with nicotine cartridges.

## 2023-05-14 NOTE — Consult Note (Signed)
 Pharmacy Antibiotic Note  Tina Barrera is a 32 y.o. female admitted on 05/14/2023 with pneumonia.  Pharmacy has been consulted for Vancomycin  dosing.  Plan: Vancomycin  2000mg  LD, then Vancomycin  1000 mg IV Q 12 hrs.  Goal AUC 400-550. Expected AUC: 482 Expected Cmin:13.7 SCr used: 0.92   Height: 5\' 5"  (165.1 cm) Weight: 81.6 kg (180 lb) IBW/kg (Calculated) : 57  Temp (24hrs), Avg:98.7 F (37.1 C), Min:98.5 F (36.9 C), Max:98.8 F (37.1 C)  Recent Labs  Lab 05/14/23 0453  WBC 14.4*  CREATININE 0.92  LATICACIDVEN 0.8    Estimated Creatinine Clearance: 93.4 mL/min (by C-G formula based on SCr of 0.92 mg/dL).    No Known Allergies  Antimicrobials this admission: cefepime  5/3 >>  vancomycin  5/3 >>   Dose adjustments this admission: n/a  Microbiology results: 5/3 BCx: in process 5/3 MRSA PCR: ordered  Thank you for allowing pharmacy to be a part of this patient's care.  Kendry Pfarr Rodriguez-Guzman PharmD, BCPS 05/14/2023 1:06 PM

## 2023-05-14 NOTE — ED Notes (Signed)
 Vanc and K+ runner unable to be hung at this time due to cefepime  still running. Pt has almost a full bag left to admin. IV very positional.

## 2023-05-14 NOTE — H&P (Addendum)
 History and Physical    Patient: Tina Barrera ZOX:[id number] DOB: 25-Apr-1990 DOA: 05/14/2023 DOS: the patient was seen and examined on 05/14/2023 PCP: Rex Castor, MD  Patient coming from: Home  Chief Complaint:  Chief Complaint  Patient presents with   Shortness of Breath   HPI: Tina Barrera is a 33 y.o. female with medical history significant of IV drug use/polysubstance abuse (methamphetamine, nicotine, marijuana), anxiety, bipolar disorder, depression, HTN, and sinus tachycardia.  She presents to [facility] ED with reports of dyspnea, of note she was recently hospitalized approximately 1 month ago for pneumonia and has been compliant with medications at discharge.  Reports dyspnea worsened 1 to 2 days ago, endorses productive cough with white/gray phlegm.  Denies fevers, chills, chest pain, palpitations, or dizziness.  She was seen by her PCP 4/30 and x-ray at that time showed right lower lobe pneumonia and she was subsequently started on doxycycline outpatient for which she reports compliance.  She endorses tobacco, methamphetamine, and marijuana use.  Reports that she snorts methamphetamine and infrequently injects it.  She smokes marijuana and vapes with nicotine cartridges.  ED Course: On arrival to [facility] ED patient was noted to be afebrile temp 37.1C, BP 143/93, HR 134, RR 16, SPO2 99% on Room air. CXR obtained and show patchy airspace disease in the base of both lungs. Labs notable for  WBC 14.4, HGB 10.9, K 3.2, resp panel (-) for COVID, flu, RSV. Blood cultures drawn and pending. She received Cefepime , vancomycin , 10 mEQ of K, 1L NS. TRH contacted for admission.   Clinical Course as of 05/14/23 1202  Sat May 14, 2023  0603 Laboratory results demonstrate moderate leukocytosis with WBC 14.4, mild hypokalemia with potassium 3.2.  Lactic acid is negative.  X-ray from 4/30 demonstrated pneumonia in right middle lobe; tonight patient has multifocal  pneumonia despite being on doxycycline since 4/30.  Will initiate IV antibiotics and consult hospitalist services for evaluation and admission. [JS]    Clinical Course User Index [JS] Sung, Jade J, MD    Review of Systems: As mentioned in the history of present illness. All other systems reviewed and are negative. Past Medical History:  Diagnosis Date   Anxiety    Depression    Herpes    Staph skin infection    Past Surgical History:  Procedure Laterality Date   NO PAST SURGERIES     Social History:  reports that she has been smoking cigarettes. She has never used smokeless tobacco. She reports that she does not currently use alcohol. She reports current drug use. Drug: Methamphetamines.  No Known Allergies  Family History  Problem Relation Age of Onset   Healthy Mother    Healthy Father     Prior to Admission medications   Medication Sig Start Date End Date Taking? Authorizing Provider  acetaminophen  (TYLENOL ) 325 MG tablet Take 2 tablets (650 mg total) by mouth every 6 (six) hours as needed for mild pain, fever or headache. 12/02/19   Tiajuana Fluke, MD  amitriptyline (ELAVIL) 100 MG tablet Take 100 mg by mouth daily as needed.    [provider]  amLODipine (NORVASC) 2.5 MG tablet Take 2.5 mg by mouth daily. 05/24/22 05/24/23  [provider]  ARIPiprazole  (ABILIFY ) 30 MG tablet Take 30 mg by mouth daily. 11/09/19   [provider]  atomoxetine  (STRATTERA ) 40 MG capsule Take 40 mg by mouth in the morning and at bedtime.     [provider]  Buprenorphine  HCl-Naloxone  HCl 8-2 MG FILM Place 1 Film under the tongue 3 (three) times daily.     [provider]  chlorhexidine  (PERIDEX ) 0.12 % solution Use as directed 15 mLs in the mouth or throat 2 (two) times daily. 04/08/20   Ward, Clover Dao, DO  divalproex  (DEPAKOTE  ER) 500 MG 24 hr tablet Take 500 mg by mouth in the morning and at bedtime.    [provider]  Elderberry 575  MG/5ML SYRP Take 1 Dose by mouth as directed.    [provider]  FLUoxetine  (PROZAC ) 20 MG capsule Take 20 mg by mouth daily.     [provider]  hydrochlorothiazide (HYDRODIURIL) 25 MG tablet Take 25 mg by mouth daily.    [provider]  ipratropium (ATROVENT) 0.03 % nasal spray Place 2 sprays into both nostrils 3 (three) times daily.    [provider]  lithium carbonate 300 MG capsule Take 300 mg by mouth 2 (two) times daily.    [provider]  nystatin  (MYCOSTATIN ) 100000 UNIT/ML suspension Take 5 mLs (500,000 Units total) by mouth 4 (four) times daily. 03/20/21   Kent Pear, NP  phenazopyridine  (PYRIDIUM ) 200 MG tablet Take 1 tablet (200 mg total) by mouth 3 (three) times daily as needed for pain. 02/12/21   Ethlyn Herd, MD  QUEtiapine  (SEROQUEL ) 300 MG tablet Take 300 mg by mouth daily.     [provider]    Physical Exam: Vitals:   05/14/23 0500 05/14/23 0530 05/14/23 0600 05/14/23 0630  BP: (!) 141/91 122/84 122/73 123/81  Pulse: (!) 134 (!) 128 (!) 128 (!) 120  Resp: 16 10 10 12   Temp:      TempSrc:      SpO2: 99% 99% 98% 100%  Weight:      Height:        Constitutional: NAD, calm, comfortable Eyes: PERRL, lids and conjunctivae normal ENMT: Mucous membranes are moist. Posterior pharynx clear of any exudate or lesions. Poor dentition.  Neck: normal, supple, no masses, no thyromegaly Respiratory: Faint inspiratory wheezing, (+) rhonchi, no crackles. Normal respiratory effort. No accessory muscle use.  Cardiovascular: Regular rate and rhythm, no murmurs / rubs / gallops. No extremity edema. 2+ radial and radial and pedal pulses.  Abdomen: no tenderness, no masses palpated.  Bowel sounds positive x4 quadrants.  Musculoskeletal: no clubbing / cyanosis. No joint deformity upper and lower extremities. Good ROM, no contractures. Normal muscle tone.  Skin: Warm, dry. No rashes, lesions, ulcers.  Neurologic: CN 2-12  grossly intact. Alert and oriented x 3.    Data Reviewed: CBC    Component Value Date/Time   WBC 14.4 (H) 05/14/2023 0453   RBC 3.87 05/14/2023 0453   HGB 10.9 (L) 05/14/2023 0453   HGB 12.6 11/11/2013 1600   HCT 31.8 (L) 05/14/2023 0453   HCT 36.9 11/11/2013 1600   PLT 318 05/14/2023 0453   PLT 154 11/11/2013 1600   MCV 82.2 05/14/2023 0453   MCV 88 11/11/2013 1600   MCH 28.2 05/14/2023 0453   MCHC 34.3 05/14/2023 0453   RDW 13.2 05/14/2023 0453   RDW 12.4 11/11/2013 1600   LYMPHSABS 1.8 05/14/2023 0453   LYMPHSABS 1.7 11/11/2013 1600   MONOABS 0.6 05/14/2023 0453   MONOABS 0.5 11/11/2013 1600   EOSABS 0.0 05/14/2023 0453   EOSABS 0.1 11/11/2013 1600   BASOSABS 0.0 05/14/2023 0453   BASOSABS 0.0 11/11/2013 1600   CMP     Component Value Date/Time  NA 134 (L) 05/14/2023 0453   NA 134 (L) 04/22/2013 1555   K 3.2 (L) 05/14/2023 0453   K 4.0 04/22/2013 1555   CL 101 05/14/2023 0453   CL 102 04/22/2013 1555   CO2 20 (L) 05/14/2023 0453   CO2 28 04/22/2013 1555   GLUCOSE 134 (H) 05/14/2023 0453   GLUCOSE 104 (H) 04/22/2013 1555   BUN 8 05/14/2023 0453   BUN 11 04/22/2013 1555   CREATININE 0.92 05/14/2023 0453   CREATININE 1.00 04/22/2013 1555   CALCIUM 9.1 05/14/2023 0453   CALCIUM 9.4 04/22/2013 1555   PROT 7.7 05/14/2023 0453   PROT 8.6 (H) 03/08/2012 0358   ALBUMIN 3.5 05/14/2023 0453   ALBUMIN 3.9 03/08/2012 0358   AST 16 05/14/2023 0453   AST 21 03/08/2012 0358   ALT 16 05/14/2023 0453   ALT 19 03/08/2012 0358   ALKPHOS 76 05/14/2023 0453   ALKPHOS 89 03/08/2012 0358   BILITOT 0.8 05/14/2023 0453   BILITOT 0.3 03/08/2012 0358   GFRNONAA >60 05/14/2023 0453   GFRNONAA >60 04/22/2013 1555   Lactic Acid, Venous    Component Value Date/Time   LATICACIDVEN 0.8 05/14/2023 0453   BNP    Component Value Date/Time   BNP 17.6 05/14/2023 0453    Cardiac Panel (last 3 results) Recent Labs    05/14/23 0453  TROPONINIHS 3   Results for orders placed  or performed during the hospital encounter of 05/14/23  Resp panel by RT-PCR (RSV, Flu A&B, Covid) Anterior Nasal Swab     Status: None   Collection Time: 05/14/23  4:53 AM   Specimen: Anterior Nasal Swab  Result Value Ref Range Status   SARS Coronavirus 2 by RT PCR NEGATIVE NEGATIVE Final    Comment: (NOTE) SARS-CoV-2 target nucleic acids are NOT DETECTED.  The SARS-CoV-2 RNA is generally detectable in upper respiratory specimens during the acute phase of infection. The lowest concentration of SARS-CoV-2 viral copies this assay can detect is 138 copies/mL. A negative result does not preclude SARS-Cov-2 infection and should not be used as the sole basis for treatment or other patient management decisions. A negative result may occur with  improper specimen collection/handling, submission of specimen other than nasopharyngeal swab, presence of viral mutation(s) within the areas targeted by this assay, and inadequate number of viral copies(<138 copies/mL). A negative result must be combined with clinical observations, patient history, and epidemiological information. The expected result is Negative.  Fact Sheet for Patients:  BloggerCourse.com  Fact Sheet for Healthcare Providers:  SeriousBroker.it  This test is no t yet approved or cleared by the [place]  FDA and  has been authorized for detection and/or diagnosis of SARS-CoV-2 by FDA under an Emergency Use Authorization (EUA). This EUA will remain  in effect (meaning this test can be used) for the duration of the COVID-19 declaration under Section 564(b)(1) of the Act, 21 U.S.C.section 360bbb-3(b)(1), unless the authorization is terminated  or revoked sooner.       Influenza A by PCR NEGATIVE NEGATIVE Final   Influenza B by PCR NEGATIVE NEGATIVE Final    Comment: (NOTE) The Xpert Xpress SARS-CoV-2/FLU/RSV plus assay is intended as an aid in the diagnosis of influenza from  Nasopharyngeal swab specimens and should not be used as a sole basis for treatment. Nasal washings and aspirates are unacceptable for Xpert Xpress SARS-CoV-2/FLU/RSV testing.  Fact Sheet for Patients: BloggerCourse.com  Fact Sheet for Healthcare Providers: SeriousBroker.it  This test is not yet approved or cleared by  the [place]  FDA and has been authorized for detection and/or diagnosis of SARS-CoV-2 by FDA under an Emergency Use Authorization (EUA). This EUA will remain in effect (meaning this test can be used) for the duration of the COVID-19 declaration under Section 564(b)(1) of the Act, 21 U.S.C. section 360bbb-3(b)(1), unless the authorization is terminated or revoked.     Resp Syncytial Virus by PCR NEGATIVE NEGATIVE Final    Comment: (NOTE) Fact Sheet for Patients: BloggerCourse.com  Fact Sheet for Healthcare Providers: SeriousBroker.it  This test is not yet approved or cleared by the [place]  FDA and has been authorized for detection and/or diagnosis of SARS-CoV-2 by FDA under an Emergency Use Authorization (EUA). This EUA will remain in effect (meaning this test can be used) for the duration of the COVID-19 declaration under Section 564(b)(1) of the Act, 21 U.S.C. section 360bbb-3(b)(1), unless the authorization is terminated or revoked.  Performed at [facility], [address], [place], [place] [address]    DG Chest [place] 1 View Result Date: 05/14/2023 CLINICAL DATA:  Shortness of breath. Recently discharged after admission for pneumonia. EXAM: PORTABLE CHEST 1 VIEW COMPARISON:  PA and lateral chest 07/23/2022 FINDINGS: There is patchy airspace disease in the base of both lungs consistent with multilobar pneumonia. No pleural effusion is seen. The remaining lungs are clear. The cardiomediastinal silhouette and vascular pattern are normal. Thoracic  cage intact. IMPRESSION: Patchy airspace disease in the base of both lungs consistent with multilobar pneumonia. Clinical correlation and radiographic follow-up to clearing recommended. Electronically Signed   By: Denman Fischer M.D.   On: 05/14/2023 05:38    Assessment and Plan: ##Multilobar Pneumonia CXR with multilobar pneumonia, recent hospital admission for the same  - With recent hospitalization will continue IV Cefepime  and Vancomycin  - PRN Nebulizers - Mucinex - Supplemental O2 as needed - Pulmonary toilet: Mobilize/incentive spirometry/flutter valve   ##Hypokalemia IV K given in ED - 20 mEQ PO K x1 - Replete PRN - Check Mg-->1.7 - Repeat BMP with AM labs  #Hypertension #Sinus Tachycardia -Continue home metoprolol   #Hx of Polysubstance Abuse Admits to snorting Methamphetamine and infrequently injecting it, admits to Marijuana use. Reports prior history of IV heroin use but reports has not used Heroin in over a year. - Continue home Suboxone , PDMP reviewed - Nicotine replacement therapy while inpatient with patch and gum - Urine drug screen - Patient counseled on cessation of methamphetamine, tobacco/nicotine, and marijuana - Continue home Chantix  #Bipolar I  #Anxiety #Depression - Continue home Prozac , Elavil, Latuda, Prazosin, and Atarax PRN  Advance Care Planning:   Code Status: Full Code   Consults: N/A  Family Communication: No family at bedside  Severity of Illness: The appropriate patient status for this patient is INPATIENT. Inpatient status is judged to be reasonable and necessary in order to provide the required intensity of service to ensure the patient's safety. The patient's presenting symptoms, physical exam findings, and initial radiographic and laboratory data in the context of their chronic comorbidities is felt to place them at high risk for further clinical deterioration. Furthermore, it is not anticipated that the patient will be medically  stable for discharge from the hospital within 2 midnights of admission.   * I certify that at the point of admission it is my clinical judgment that the patient will require inpatient hospital care spanning beyond 2 midnights from the point of admission due to high intensity of service, high risk for further deterioration and high frequency of  surveillance required.*  To reach the provider On-Call:   7AM- 7PM see care teams to locate the attending and reach out to them via [email or web address] Password: [email or web address] 7PM-7AM contact night-coverage If you still have difficulty reaching the appropriate provider, please page the [facility] (Director on Call) for [facility] on amion for assistance  This document was prepared using Conservation officer, historic buildings and may include unintentional dictation errors.  Naida Austria FNP-BC, PMHNP-BC Nurse Practitioner [facility]

## 2023-05-14 NOTE — ED Provider Notes (Signed)
 [facility] Provider Note    Event Date/Time   First MD Initiated Contact with Patient 05/14/23 [phone]     (approximate)   History   Shortness of Breath   HPI  Tina Barrera is a 33 y.o. female brought to the ED via EMS from home with a chief complaint of shortness of breath.  Patient had recent hospitalization approximately 1 month ago for pneumonia.  Reports compliance with her medications.  Endorses shortness of breath x 1 to 2 days.  Endorses tobacco and methamphetamine use.  Denies fever/chills, chest pain, abdominal pain, nausea, vomiting or dizziness.  Seen by PCP 4/30 with x-ray showing right middle lobe pneumonia and started on doxycycline.     Past Medical History   Past Medical History:  Diagnosis Date   Anxiety    Depression    Herpes    Staph skin infection      Active Problem List   Patient Active Problem List   Diagnosis Date Noted   Sepsis due to cellulitis (HCC) 12/01/2019   Cellulitis and abscess of hand 11/30/2019   Intravenous drug abuse (HCC) 11/30/2019   Cellulitis of right upper extremity 11/30/2019   Cellulitis of left upper extremity 11/30/2019   Bipolar 1 disorder (HCC) 11/30/2019   Chronic hepatitis C without hepatic coma (HCC) 11/30/2019     Past Surgical History   Past Surgical History:  Procedure Laterality Date   NO PAST SURGERIES       Home Medications   Prior to Admission medications   Medication Sig Start Date End Date Taking? Authorizing Provider  acetaminophen  (TYLENOL ) 325 MG tablet Take 2 tablets (650 mg total) by mouth every 6 (six) hours as needed for mild pain, fever or headache. 12/02/19   Tiajuana Fluke, MD  amitriptyline (ELAVIL) 100 MG tablet Take 100 mg by mouth daily as needed.    [provider]  amLODipine (NORVASC) 2.5 MG tablet Take 2.5 mg by mouth daily. 05/24/22 05/24/23  [provider]  ARIPiprazole  (ABILIFY ) 30 MG tablet Take 30 mg by mouth daily.  11/09/19   [provider]  atomoxetine  (STRATTERA ) 40 MG capsule Take 40 mg by mouth in the morning and at bedtime.     [provider]  Buprenorphine  HCl-Naloxone  HCl 8-2 MG FILM Place 1 Film under the tongue 3 (three) times daily.     [provider]  chlorhexidine  (PERIDEX ) 0.12 % solution Use as directed 15 mLs in the mouth or throat 2 (two) times daily. 04/08/20   Ward, Clover Dao, DO  divalproex  (DEPAKOTE  ER) 500 MG 24 hr tablet Take 500 mg by mouth in the morning and at bedtime.    [provider]  Elderberry 575 MG/5ML SYRP Take 1 Dose by mouth as directed.    [provider]  FLUoxetine  (PROZAC ) 20 MG capsule Take 20 mg by mouth daily.     [provider]  hydrochlorothiazide (HYDRODIURIL) 25 MG tablet Take 25 mg by mouth daily.    [provider]  ipratropium (ATROVENT) 0.03 % nasal spray Place 2 sprays into both nostrils 3 (three) times daily.    [provider]  lithium carbonate 300 MG capsule Take 300 mg by mouth 2 (two) times daily.    [provider]  nystatin  (MYCOSTATIN ) 100000 UNIT/ML suspension Take 5 mLs (500,000 Units total) by mouth 4 (four) times daily. 03/20/21   Kent Pear, NP  phenazopyridine  (PYRIDIUM ) 200 MG tablet Take 1 tablet (200  mg total) by mouth 3 (three) times daily as needed for pain. 02/12/21   Ethlyn Herd, MD  QUEtiapine  (SEROQUEL ) 300 MG tablet Take 300 mg by mouth daily.     [provider]     Allergies  Patient has no known allergies.   Family History   Family History  Problem Relation Age of Onset   Healthy Mother    Healthy Father      Physical Exam  Triage Vital Signs: ED Triage Vitals  Encounter Vitals Group     BP --      Systolic BP Percentile --      Diastolic BP Percentile --      Pulse --      Resp --      Temp 05/14/23 0447 98.8 F (37.1 C)     Temp Source 05/14/23 0447 Oral     SpO2 --      Weight 05/14/23 0448 180 lb (81.6 kg)      Height 05/14/23 0448 5\' 5"  (1.651 m)     Head Circumference --      Peak Flow --      Pain Score 05/14/23 0448 8     Pain Loc --      Pain Education --      Exclude from Growth Chart --     Updated Vital Signs: BP (!) 143/93   Pulse (!) 142   Temp 98.8 F (37.1 C) (Oral)   Ht 5\' 5"  (1.651 m)   Wt 81.6 kg   LMP 05/03/2023   SpO2 100%   BMI 29.95 kg/m    General: Awake, mild distress.  CV:  Tachycardic.  Good peripheral perfusion.  Resp:  Normal effort.  Scattered wheezing. Abd:  Nontender.  No distention.  Other:  Bilateral calves are supple and nontender.   ED Results / Procedures / Treatments  Labs (all labs ordered are listed, but only abnormal results are displayed) Labs Reviewed  CBC WITH DIFFERENTIAL/PLATELET - Abnormal; Notable for the following components:      Result Value   WBC 14.4 (*)    Hemoglobin 10.9 (*)    HCT 31.8 (*)    Neutro Abs 11.9 (*)    Abs Immature Granulocytes 0.09 (*)    All other components within normal limits  COMPREHENSIVE METABOLIC PANEL WITH GFR - Abnormal; Notable for the following components:   Sodium 134 (*)    Potassium 3.2 (*)    CO2 20 (*)    Glucose, Bld 134 (*)    All other components within normal limits  RESP PANEL BY RT-PCR (RSV, FLU A&B, COVID)  [id number]  CULTURE, BLOOD (ROUTINE X 2)  CULTURE, BLOOD (ROUTINE X 2)  LACTIC ACID, PLASMA  BRAIN NATRIURETIC PEPTIDE  LACTIC ACID, PLASMA  TROPONIN I (HIGH SENSITIVITY)     EKG  ED ECG REPORT I, Olanrewaju Osborn J, the attending physician, personally viewed and interpreted this ECG.   Date: 05/14/2023  EKG Time: 0450  Rate: 134  Rhythm: sinus tachycardia  Axis: Normal  Intervals:none  ST&T Change: Nonspecific    RADIOLOGY I have independently visualized interpreted patient's imaging study as well as noted the radiology interpretation:  Chest x-ray: Multi lobar pneumonia  Official radiology report(s): DG Chest Port 1 View Result Date: 05/14/2023 CLINICAL DATA:   Shortness of breath. Recently discharged after admission for pneumonia. EXAM: PORTABLE CHEST 1 VIEW COMPARISON:  PA and lateral chest 07/23/2022 FINDINGS: There is patchy airspace disease in the base of both  lungs consistent with multilobar pneumonia. No pleural effusion is seen. The remaining lungs are clear. The cardiomediastinal silhouette and vascular pattern are normal. Thoracic cage intact. IMPRESSION: Patchy airspace disease in the base of both lungs consistent with multilobar pneumonia. Clinical correlation and radiographic follow-up to clearing recommended. Electronically Signed   By: Denman Fischer M.D.   On: 05/14/2023 05:38     PROCEDURES:  Critical Care performed: Yes, see critical care procedure note(s)  CRITICAL CARE Performed by: Norlene Beavers   Total critical care time: 30 minutes  Critical care time was exclusive of separately billable procedures and treating other patients.  Critical care was necessary to treat or prevent imminent or life-threatening deterioration.  Critical care was time spent personally by me on the following activities: development of treatment plan with patient and/or surrogate as well as nursing, discussions with consultants, evaluation of patient's response to treatment, examination of patient, obtaining history from patient or surrogate, ordering and performing treatments and interventions, ordering and review of laboratory studies, ordering and review of radiographic studies, pulse oximetry and re-evaluation of patient's condition.   Aaron Aas1-3 Lead EKG Interpretation  Performed by: Norlene Beavers, MD Authorized by: Norlene Beavers, MD     Interpretation: abnormal     ECG rate:  140   ECG rate assessment: tachycardic     Rhythm: sinus tachycardia     Ectopy: none     Conduction: normal   Comments:     Patient placed on cardiac monitor to evaluate for arrhythmias    MEDICATIONS ORDERED IN ED: Medications  vancomycin  (VANCOCIN ) IVPB 1000 mg/200 mL  premix (has no administration in time range)  ceFEPIme  (MAXIPIME ) 2 g in sodium chloride  0.9 % 100 mL IVPB (has no administration in time range)  potassium chloride  10 mEq in 100 mL IVPB (has no administration in time range)  ipratropium-albuterol (DUONEB) 0.5-2.5 (3) MG/3ML nebulizer solution 3 mL (3 mLs Nebulization Given 05/14/23 0530)  methylPREDNISolone sodium succinate (SOLU-MEDROL) 125 mg/2 mL injection 125 mg (125 mg Intravenous Given 05/14/23 0528)  sodium chloride  0.9 % bolus 1,000 mL (1,000 mLs Intravenous New Bag/Given 05/14/23 0530)  LORazepam  (ATIVAN ) injection 0.5 mg (0.5 mg Intravenous Given 05/14/23 0529)     IMPRESSION / MDM / ASSESSMENT AND PLAN / ED COURSE  I reviewed the triage vital signs and the nursing notes.                             33 year old female presenting with shortness of breath. Differential includes, but is not limited to, viral syndrome, bronchitis including COPD exacerbation, pneumonia, reactive airway disease including asthma, CHF including exacerbation with or without pulmonary/interstitial edema, pneumothorax, ACS, thoracic trauma, and pulmonary embolism.  I personally reviewed patient's records and note her hospitalization 04/14/2023 as well as her PCP office follow-up on 05/11/2023.  Patient's presentation is most consistent with acute complicated illness / injury requiring diagnostic workup.  The patient is on the cardiac monitor to evaluate for evidence of arrhythmia and/or significant heart rate changes.  Will obtain sepsis workup, administer IV Solu-Medrol, DuoNeb, Ativan , IV fluids.  Will reassess.  Clinical Course as of 05/14/23 0610  Sat May 14, 2023  0603 Laboratory results demonstrate moderate leukocytosis with WBC 14.4, mild hypokalemia with potassium 3.2.  Lactic acid is negative.  X-ray from 4/30 demonstrated pneumonia in right middle lobe; tonight patient has multifocal pneumonia despite being on doxycycline since 4/30.  Will initiate IV  antibiotics  and consult hospitalist services for evaluation and admission. [JS]    Clinical Course User Index [JS] Norlene Beavers, MD     FINAL CLINICAL IMPRESSION(S) / ED DIAGNOSES   Final diagnoses:  Shortness of breath  Multifocal pneumonia  Hypokalemia     Rx / DC Orders   ED Discharge Orders     None        Note:  This document was prepared using Dragon voice recognition software and may include unintentional dictation errors.   Godfrey Tritschler J, MD 05/14/23 [phone]

## 2023-05-15 DIAGNOSIS — F319 Bipolar disorder, unspecified: Secondary | ICD-10-CM | POA: Diagnosis not present

## 2023-05-15 DIAGNOSIS — I10 Essential (primary) hypertension: Secondary | ICD-10-CM | POA: Diagnosis not present

## 2023-05-15 DIAGNOSIS — J189 Pneumonia, unspecified organism: Secondary | ICD-10-CM | POA: Diagnosis not present

## 2023-05-15 DIAGNOSIS — E876 Hypokalemia: Secondary | ICD-10-CM | POA: Diagnosis not present

## 2023-05-15 DIAGNOSIS — R000 Tachycardia, unspecified: Secondary | ICD-10-CM

## 2023-05-15 LAB — CBC
HCT: 33.2 % — ABNORMAL LOW (ref 36.0–46.0)
Hemoglobin: 11.2 g/dL — ABNORMAL LOW (ref 12.0–15.0)
MCH: 27.9 pg (ref 26.0–34.0)
MCHC: 33.7 g/dL (ref 30.0–36.0)
MCV: 82.8 fL (ref 80.0–100.0)
Platelets: 365 10*3/uL (ref 150–400)
RBC: 4.01 MIL/uL (ref 3.87–5.11)
RDW: 13.4 % (ref 11.5–15.5)
WBC: 21.3 10*3/uL — ABNORMAL HIGH (ref 4.0–10.5)
nRBC: 0 % (ref 0.0–0.2)

## 2023-05-15 LAB — PROCALCITONIN: Procalcitonin: <0.1 ng/mL

## 2023-05-15 LAB — HIV ANTIBODY (ROUTINE TESTING W REFLEX): HIV Screen 4th Generation wRfx: NONREACTIVE

## 2023-05-15 LAB — BASIC METABOLIC PANEL WITH GFR
Anion gap: 10 (ref 5–15)
BUN: 14 mg/dL (ref 6–20)
CO2: 22 mmol/L (ref 22–32)
Calcium: 9.7 mg/dL (ref 8.9–10.3)
Chloride: 106 mmol/L (ref 98–111)
Creatinine, Ser: 0.96 mg/dL (ref 0.44–1.00)
GFR, Estimated: >60 mL/min (ref >60)
Glucose, Bld: 103 mg/dL — ABNORMAL HIGH (ref 70–99)
Potassium: 3.9 mmol/L (ref 3.5–5.1)
Sodium: 138 mmol/L (ref 135–145)

## 2023-05-15 NOTE — Plan of Care (Signed)
  Problem: Education: Goal: Knowledge of General Education information will improve Description: Including pain rating scale, medication(s)/side effects and non-pharmacologic comfort measures Outcome: Progressing   Problem: Clinical Measurements: Goal: Diagnostic test results will improve Outcome: Progressing   Problem: Nutrition: Goal: Adequate nutrition will be maintained Outcome: Progressing   Problem: Coping: Goal: Level of anxiety will decrease Outcome: Progressing   Problem: Safety: Goal: Ability to remain free from injury will improve Outcome: Progressing   Problem: Respiratory: Goal: Ability to maintain adequate ventilation will improve Outcome: Progressing Goal: Ability to maintain a clear airway will improve Outcome: Progressing

## 2023-05-15 NOTE — Plan of Care (Signed)

## 2023-05-15 NOTE — Progress Notes (Signed)
 PROGRESS NOTE    Tina Barrera  UXL:[id number] DOB: 26-Jun-1990 DOA: 05/14/2023 PCP: Rex Castor, MD  Chief Complaint  Patient presents with   Shortness of Breath    Hospital Course:  Tina Barrera is a 33 year old female with history of IV drug abuse, polysubstance abuse (methamphetamines, heroin, nicotine, marijuana), hypertension, depression, bipolar disorder, anxiety, history of sinus tach.  She presents to [facility] with complaints of dyspnea.  Patient was hospitalized a month ago for pneumonia and reports that she completed all antibiotics as prescribed at discharge.  He was seen by her PCP on 4/30 and x-ray at that time revealed right lower lobe pneumonia and she was started on doxycycline.  She reports she took all medication as prescribed.  She does report that she snorted methamphetamines not long prior to arrival.  On arrival to the ED she was afebrile, tachycardic to 134, over 95% on room air.  Chest x-ray revealed patchy airspace opacity in base of both lungs.  Labs are notable for a leukocytosis of 14.4.  Respiratory viral panel negative.  Blood cultures drawn.  She was started on cefepime , vancomycin .  Subjective: This morning patient reports she still feeling very dyspneic upon minimal exertion.  She is also endorsing increased urinary frequency.  Her white count has gone up overnight   Objective: Vitals:   05/14/23 2232 05/15/23 0343 05/15/23 0809 05/15/23 1600  BP: (!) 100/52 107/76 102/71 (!) 88/56  Pulse: 99 90 87 76  Resp: 18 18 16 16   Temp: 98.2 F (36.8 C) 97.6 F (36.4 C) 98.4 F (36.9 C) 98 F (36.7 C)  TempSrc:   Oral   SpO2: 97% 97% 96% 98%  Weight:      Height:        Intake/Output Summary (Last 24 hours) at 05/15/2023 1608 Last data filed at 05/15/2023 0318 Gross per 24 hour  Intake 690.67 ml  Output --  Net 690.67 ml   Filed Weights   05/14/23 0448  Weight: 81.6 kg    Examination: General exam: Appears calm and comfortable, NAD   Respiratory system: No work of breathing, some rhonchi bilaterally, occasional cough Cardiovascular system: S1 & S2 heard, RRR.  Gastrointestinal system: Abdomen is nondistended, soft and nontender.  Neuro: Alert and oriented. No focal neurological deficits. Extremities: Symmetric, expected ROM Skin: No rashes, lesions Psychiatry: Demonstrates appropriate judgement and insight. Mood & affect appropriate for situation.   Assessment & Plan:  Principal Problem:   Multifocal pneumonia Active Problems:   Polysubstance abuse (HCC)   Bipolar 1 disorder (HCC)   Essential hypertension   Sinus tachycardia   Hypokalemia   Multilobar pneumonia - Recent hospitalization 1 month ago and prior outpatient treatment with doxycycline - Will proceed with treatment for presumed hospital-acquired pneumonia.  On IV cefepime  and vancomycin . - MRSA PCR negative, will discontinue vancomycin  - Interestingly procalcitonin less than 0.1 despite clinical signs and symptoms of pneumonia. - Continue with as needed nebulizers and Mucinex - Supplemental O2 as required - Continue pulmonary toilet  Leukocytosis - 14.4 on arrival, continues to rise - Will continue to follow blood cultures - Have obtained UA today given patient's complaints of urinary frequency.  Should be appropriately covered by current antibiotics - Continue to trend CBC closely  Hypokalemia - Replace as needed - Trend CMP  Hypertension Chronic sinus tachycardia - Resume home dose metoprolol   History of polysubstance abuse - Has history of IV heroin and IV methamphetamine use.  Most recently has been snorting methamphetamine - On  Suboxone  at home, will continue - Continue nicotine replacement therapy - Urine drug screen consistent with no medications - Patient has been counseled on cessation  Tobacco abuse - Continue with nicotine replacement - Continue home dose Chantix - Counseled on cessation  Bipolar  1 Anxiety Depression - At home on: Prozac , Elavil, Latuda, prazosin, as needed Atarax.  Will continue all of the above for now.  DVT prophylaxis: Lovenox    Code Status: Full Code Disposition: Inpatient pending clinical resolution  Consultants:    Procedures:    Antimicrobials:  Anti-infectives (From admission, onward)    Start     Dose/Rate Route Frequency Ordered Stop   05/15/23 0100  vancomycin  (VANCOCIN ) IVPB 1000 mg/200 mL premix        1,000 mg 200 mL/hr over 60 Minutes Intravenous Every 12 hours 05/14/23 1308     05/14/23 1730  vancomycin  (VANCOCIN ) IVPB 1000 mg/200 mL premix        1,000 mg 200 mL/hr over 60 Minutes Intravenous  Once 05/14/23 1653 05/14/23 1926   05/14/23 1400  ceFEPIme  (MAXIPIME ) 2 g in sodium chloride  0.9 % 100 mL IVPB        2 g 200 mL/hr over 30 Minutes Intravenous Every 8 hours 05/14/23 0748     05/14/23 1300  vancomycin  (VANCOCIN ) IVPB 1000 mg/200 mL premix  Status:  Discontinued        1,000 mg 200 mL/hr over 60 Minutes Intravenous  Once 05/14/23 1245 05/14/23 1653   05/14/23 0615  vancomycin  (VANCOCIN ) IVPB 1000 mg/200 mL premix        1,000 mg 200 mL/hr over 60 Minutes Intravenous  Once 05/14/23 0607 05/14/23 1501   05/14/23 0615  ceFEPIme  (MAXIPIME ) 2 g in sodium chloride  0.9 % 100 mL IVPB        2 g 200 mL/hr over 30 Minutes Intravenous  Once 05/14/23 0608 05/14/23 0840       Data Reviewed: I have personally reviewed following labs and imaging studies CBC: Recent Labs  Lab 05/14/23 0453 05/15/23 0342  WBC 14.4* 21.3*  NEUTROABS 11.9*  --   HGB 10.9* 11.2*  HCT 31.8* 33.2*  MCV 82.2 82.8  PLT 318 365   Basic Metabolic Panel: Recent Labs  Lab 05/14/23 0453 05/15/23 0342  NA 134* 138  K 3.2* 3.9  CL 101 106  CO2 20* 22  GLUCOSE 134* 103*  BUN 8 14  CREATININE 0.92 0.96  CALCIUM 9.1 9.7  MG 1.7  --    GFR: Estimated Creatinine Clearance: 89.5 mL/min (by C-G formula based on SCr of 0.96 mg/dL). Liver Function  Tests: Recent Labs  Lab 05/14/23 0453  AST 16  ALT 16  ALKPHOS 76  BILITOT 0.8  PROT 7.7  ALBUMIN 3.5   CBG: No results for input(s): "GLUCAP" in the last 168 hours.  Recent Results (from the past 240 hours)  Culture, blood (routine x 2)     Status: None (Preliminary result)   Collection Time: 05/14/23  4:53 AM   Specimen: BLOOD LEFT FOREARM  Result Value Ref Range Status   Specimen Description BLOOD LEFT FOREARM  Final   Special Requests   Final    BOTTLES DRAWN AEROBIC AND ANAEROBIC Blood Culture results may not be optimal due to an inadequate volume of blood received in culture bottles   Culture   Final    NO GROWTH 1 DAY Performed at [facility], [address], [place], [place] [address]    Report Status  PENDING  Incomplete  Resp panel by RT-PCR (RSV, Flu A&B, Covid) Anterior Nasal Swab     Status: None   Collection Time: 05/14/23  4:53 AM   Specimen: Anterior Nasal Swab  Result Value Ref Range Status   SARS Coronavirus 2 by RT PCR NEGATIVE NEGATIVE Final    Comment: (NOTE) SARS-CoV-2 target nucleic acids are NOT DETECTED.  The SARS-CoV-2 RNA is generally detectable in upper respiratory specimens during the acute phase of infection. The lowest concentration of SARS-CoV-2 viral copies this assay can detect is 138 copies/mL. A negative result does not preclude SARS-Cov-2 infection and should not be used as the sole basis for treatment or other patient management decisions. A negative result may occur with  improper specimen collection/handling, submission of specimen other than nasopharyngeal swab, presence of viral mutation(s) within the areas targeted by this assay, and inadequate number of viral copies(<138 copies/mL). A negative result must be combined with clinical observations, patient history, and epidemiological information. The expected result is Negative.  Fact Sheet for Patients:  BloggerCourse.com  Fact Sheet for  Healthcare Providers:  SeriousBroker.it  This test is no t yet approved or cleared by the [place]  FDA and  has been authorized for detection and/or diagnosis of SARS-CoV-2 by FDA under an Emergency Use Authorization (EUA). This EUA will remain  in effect (meaning this test can be used) for the duration of the COVID-19 declaration under Section 564(b)(1) of the Act, 21 U.S.C.section 360bbb-3(b)(1), unless the authorization is terminated  or revoked sooner.       Influenza A by PCR NEGATIVE NEGATIVE Final   Influenza B by PCR NEGATIVE NEGATIVE Final    Comment: (NOTE) The Xpert Xpress SARS-CoV-2/FLU/RSV plus assay is intended as an aid in the diagnosis of influenza from Nasopharyngeal swab specimens and should not be used as a sole basis for treatment. Nasal washings and aspirates are unacceptable for Xpert Xpress SARS-CoV-2/FLU/RSV testing.  Fact Sheet for Patients: BloggerCourse.com  Fact Sheet for Healthcare Providers: SeriousBroker.it  This test is not yet approved or cleared by the [place]  FDA and has been authorized for detection and/or diagnosis of SARS-CoV-2 by FDA under an Emergency Use Authorization (EUA). This EUA will remain in effect (meaning this test can be used) for the duration of the COVID-19 declaration under Section 564(b)(1) of the Act, 21 U.S.C. section 360bbb-3(b)(1), unless the authorization is terminated or revoked.     Resp Syncytial Virus by PCR NEGATIVE NEGATIVE Final    Comment: (NOTE) Fact Sheet for Patients: BloggerCourse.com  Fact Sheet for Healthcare Providers: SeriousBroker.it  This test is not yet approved or cleared by the [place]  FDA and has been authorized for detection and/or diagnosis of SARS-CoV-2 by FDA under an Emergency Use Authorization (EUA). This EUA will remain in effect (meaning this  test can be used) for the duration of the COVID-19 declaration under Section 564(b)(1) of the Act, 21 U.S.C. section 360bbb-3(b)(1), unless the authorization is terminated or revoked.  Performed at [facility], [address] [place], [place] [address]   Culture, blood (routine x 2)     Status: None (Preliminary result)   Collection Time: 05/14/23  5:00 AM   Specimen: BLOOD  Result Value Ref Range Status   Specimen Description BLOOD LACTOSE FERMENTER  Final   Special Requests   Final    BOTTLES DRAWN AEROBIC AND ANAEROBIC Blood Culture adequate volume   Culture   Final    NO GROWTH 1 DAY Performed at [facility], [address] [place], [place] [address]    Report Status PENDING  Incomplete  MRSA Next Gen by PCR, Nasal     Status: None   Collection Time: 05/14/23  2:35 PM   Specimen: Nasal Mucosa; Nasal Swab  Result Value Ref Range Status   MRSA by PCR Next Gen NOT DETECTED NOT DETECTED Final    Comment: (NOTE) The GeneXpert MRSA Assay (FDA approved for NASAL specimens only), is one component of a comprehensive MRSA colonization surveillance program. It is not intended to diagnose MRSA infection nor to guide or monitor treatment for MRSA infections. Test performance is not FDA approved in patients less than 13 years old. Performed at [facility], [address] [place], [place] [address]      Radiology Studies: [facility] Chest [place] 1 View Result Date: 05/14/2023 CLINICAL DATA:  Shortness of breath. Recently discharged after admission for pneumonia. EXAM: PORTABLE CHEST 1 VIEW COMPARISON:  PA and lateral chest 07/23/2022 FINDINGS: There is patchy airspace disease in the base of both lungs consistent with multilobar pneumonia. No pleural effusion is seen. The remaining lungs are clear. The cardiomediastinal silhouette and vascular pattern are normal. Thoracic cage intact. IMPRESSION: Patchy airspace disease in the base of both lungs consistent with  multilobar pneumonia. Clinical correlation and radiographic follow-up to clearing recommended. Electronically Signed   By: Denman Fischer M.D.   On: 05/14/2023 05:38    Scheduled Meds:  amitriptyline  75 mg Oral QHS   buprenorphine -naloxone   1 tablet Sublingual BID   enoxaparin  (LOVENOX ) injection  40 mg Subcutaneous Q24H   FLUoxetine   20 mg Oral Daily   guaiFENesin  600 mg Oral BID   lurasidone  40 mg Oral Q breakfast   metoprolol  succinate  50 mg Oral Daily   nicotine  14 mg Transdermal Daily   prazosin  1 mg Oral QHS   varenicline  1 mg Oral BID   Continuous Infusions:  ceFEPime  (MAXIPIME ) IV 2 g (05/15/23 1327)   vancomycin  1,000 mg (05/15/23 1206)     LOS: 1 day  MDM: Patient is high risk for one or more organ failure.  They necessitate ongoing hospitalization for continued IV therapies and subsequent lab monitoring. Total time spent interpreting labs and vitals, reviewing the medical record, coordinating care amongst consultants and care team members, directly assessing and discussing care with the patient and/or family: 55 min  Mabelle Mungin, DO [facility]  To contact the attending physician between 7A-7P please use Epic Chat. To contact the covering physician during after hours 7P-7A, please review Amion.  05/15/2023, 4:08 PM   *This document has been created with the assistance of dictation software. Please excuse typographical errors. *

## 2023-05-16 DIAGNOSIS — I10 Essential (primary) hypertension: Secondary | ICD-10-CM | POA: Diagnosis not present

## 2023-05-16 DIAGNOSIS — F319 Bipolar disorder, unspecified: Secondary | ICD-10-CM | POA: Diagnosis not present

## 2023-05-16 DIAGNOSIS — J189 Pneumonia, unspecified organism: Secondary | ICD-10-CM | POA: Diagnosis not present

## 2023-05-16 DIAGNOSIS — E876 Hypokalemia: Secondary | ICD-10-CM | POA: Diagnosis not present

## 2023-05-16 LAB — CBC WITH DIFFERENTIAL/PLATELET
Abs Immature Granulocytes: 0.1 10*3/uL — ABNORMAL HIGH (ref 0.00–0.07)
Basophils Absolute: 0.1 10*3/uL (ref 0.0–0.1)
Basophils Relative: 1 %
Eosinophils Absolute: 0.3 10*3/uL (ref 0.0–0.5)
Eosinophils Relative: 3 %
HCT: 33 % — ABNORMAL LOW (ref 36.0–46.0)
Hemoglobin: 10.9 g/dL — ABNORMAL LOW (ref 12.0–15.0)
Immature Granulocytes: 1 %
Lymphocytes Relative: 43 %
Lymphs Abs: 3.6 10*3/uL (ref 0.7–4.0)
MCH: 27.5 pg (ref 26.0–34.0)
MCHC: 33 g/dL (ref 30.0–36.0)
MCV: 83.3 fL (ref 80.0–100.0)
Monocytes Absolute: 0.4 10*3/uL (ref 0.1–1.0)
Monocytes Relative: 5 %
Neutro Abs: 4 10*3/uL (ref 1.7–7.7)
Neutrophils Relative %: 47 %
Platelets: 272 10*3/uL (ref 150–400)
RBC: 3.96 MIL/uL (ref 3.87–5.11)
RDW: 13.4 % (ref 11.5–15.5)
WBC: 8.4 10*3/uL (ref 4.0–10.5)
nRBC: 0 % (ref 0.0–0.2)

## 2023-05-16 LAB — URINALYSIS, COMPLETE (UACMP) WITH MICROSCOPIC
Bilirubin Urine: NEGATIVE
Glucose, UA: NEGATIVE mg/dL
Hgb urine dipstick: NEGATIVE
Ketones, ur: NEGATIVE mg/dL
Leukocytes,Ua: NEGATIVE
Nitrite: NEGATIVE
Protein, ur: NEGATIVE mg/dL
Specific Gravity, Urine: 1.02 (ref 1.005–1.030)
pH: 5 (ref 5.0–8.0)

## 2023-05-16 LAB — COMPREHENSIVE METABOLIC PANEL WITH GFR
ALT: 12 U/L (ref 0–44)
AST: 12 U/L — ABNORMAL LOW (ref 15–41)
Albumin: 3 g/dL — ABNORMAL LOW (ref 3.5–5.0)
Alkaline Phosphatase: 58 U/L (ref 38–126)
Anion gap: 9 (ref 5–15)
BUN: 14 mg/dL (ref 6–20)
CO2: 19 mmol/L — ABNORMAL LOW (ref 22–32)
Calcium: 9 mg/dL (ref 8.9–10.3)
Chloride: 107 mmol/L (ref 98–111)
Creatinine, Ser: 1.09 mg/dL — ABNORMAL HIGH (ref 0.44–1.00)
GFR, Estimated: >60 mL/min (ref >60)
Glucose, Bld: 108 mg/dL — ABNORMAL HIGH (ref 70–99)
Potassium: 3.7 mmol/L (ref 3.5–5.1)
Sodium: 135 mmol/L (ref 135–145)
Total Bilirubin: 0.5 mg/dL (ref 0.0–1.2)
Total Protein: 6.7 g/dL (ref 6.5–8.1)

## 2023-05-16 LAB — PHOSPHORUS: Phosphorus: 3.5 mg/dL (ref 2.5–4.6)

## 2023-05-16 LAB — MAGNESIUM: Magnesium: 1.8 mg/dL (ref 1.7–2.4)

## 2023-05-16 MED ORDER — LEVOFLOXACIN 750 MG PO TABS: 750.0000 mg | ORAL_TABLET | Freq: Every day | ORAL | 0 refills | Status: AC

## 2023-05-16 NOTE — Plan of Care (Signed)

## 2023-05-16 NOTE — Discharge Summary (Signed)
 Physician Discharge Summary   Patient: Tina Barrera MRN: [id number] DOB: Jul 03, 1990  Admit date:     05/14/2023  Discharge date: 05/16/23  Discharge Physician: Roise Cleaver   PCP: Rex Castor, MD   Recommendations at discharge:   With primary care to ensure infection resolution and for chronic medication management.  Discharge Diagnoses: Principal Problem:   Multifocal pneumonia Active Problems:   Polysubstance abuse (HCC)   Bipolar 1 disorder (HCC)   Essential hypertension   Sinus tachycardia   Hypokalemia  Resolved Problems:   * No resolved hospital problems. [facility] Course: Tina Barrera is a 33 year old female with history of IV drug abuse, polysubstance abuse (methamphetamines, heroin, nicotine, marijuana), hypertension, depression, bipolar disorder, anxiety, history of sinus tach.  She presents to [facility] with complaints of dyspnea.  Patient was hospitalized a month ago for pneumonia and reports that she completed all antibiotics as prescribed at discharge.  He was seen by her PCP on 4/30 and x-ray at that time revealed right lower lobe pneumonia and she was started on doxycycline.  She reports she took all medication as prescribed.  She does report that she snorted methamphetamines not long prior to arrival.  On arrival to the ED she was afebrile, tachycardic to 134, over 95% on room air.  Chest x-ray revealed patchy airspace opacity in base of both lungs.  Labs are notable for a leukocytosis of 14.4.  Respiratory viral panel negative.  Blood cultures drawn.  She was started on cefepime , vancomycin .  Initially WBC uptrended and patient was held for further monitoring.  By 5/5 WBC had resolved to normal and patient reported feeling back to baseline.  She performed ambulation trial with bedside RN and had no evidence of desaturation.  Patient was amendable to discharge home.  She will follow-up with her primary care physician and complete antibiotic course  outpatient  Assessment and Plan: Multilobar pneumonia - Recent hospitalization 1 month ago and prior outpatient treatment with doxycycline - Started on IV cefepime  and vancomycin . - MRSA PCR negative, vancomycin  discontinued - Interestingly procalcitonin less than 0.1 despite clinical signs and symptoms of pneumonia. - Will de-escalate to levofloxacin at discharge - Continue with as needed nebulizers and Mucinex - Supplemental O2 as required - Continue pulmonary toilet   Leukocytosis - Secondary to pneumonia as above, may also be reactive to recent drug use -- 14.4 -> 21.3 -> 8.4 - Blood cultures negative for 48 hours, will continue to follow - Unremarkable - Continue to trend CBC closely   Hypokalemia - replaced  Hypertension Chronic sinus tachycardia - Resume home dose metoprolol    History of polysubstance abuse - Has history of IV heroin and IV methamphetamine use.  Most recently has been snorting methamphetamine - On Suboxone  at home, will continue - Continue nicotine replacement therapy - Urine drug screen consistent with known use  - Patient has been counseled on cessation   Tobacco abuse - Continue with nicotine replacement - Continue home dose Chantix - Counseled on cessation   Bipolar 1 Anxiety Depression - At home on: Prozac , Elavil, Latuda, prazosin, as needed Atarax.  Will continue all of the above for now.  BMI 29 -- Outpatient follow up for lifestyle modification and risk factor management  Disposition: Home Diet recommendation:  Regular diet DISCHARGE MEDICATION: Allergies as of 05/16/2023   No Known Allergies      Medication List     STOP taking these medications    acetaminophen  325 MG tablet Commonly known as: TYLENOL   doxycycline 100 MG tablet Commonly known as: VIBRA-TABS       TAKE these medications    Advair Diskus 100-50 MCG/ACT Aepb Generic drug: fluticasone-salmeterol Inhale 1 puff into the lungs 2 (two) times daily.    albuterol 108 (90 Base) MCG/ACT inhaler Commonly known as: VENTOLIN HFA Inhale 2 puffs into the lungs every 6 (six) hours as needed for wheezing or shortness of breath.   amitriptyline 75 MG tablet Commonly known as: ELAVIL Take 75 mg by mouth at bedtime.   Buprenorphine  HCl-Naloxone  HCl 8-2 MG Film Place 1 Film under the tongue 3 (three) times daily.   FLUoxetine  20 MG capsule Commonly known as: PROZAC  Take 20 mg by mouth daily.   hydrOXYzine 50 MG tablet Commonly known as: ATARAX Take 50 mg by mouth 2 (two) times daily.   levofloxacin 750 MG tablet Commonly known as: Levaquin Take 1 tablet (750 mg total) by mouth daily for 5 days.   lurasidone 40 MG Tabs tablet Commonly known as: LATUDA Take 40 mg by mouth daily with breakfast.   metoprolol  succinate 50 MG 24 hr tablet Commonly known as: TOPROL -XL Take 1 tablet by mouth daily.   nystatin  100000 UNIT/ML suspension Commonly known as: MYCOSTATIN  Take 5 mLs (500,000 Units total) by mouth 4 (four) times daily.   pantoprazole 20 MG tablet Commonly known as: PROTONIX Take 20 mg by mouth 2 (two) times daily before a meal.   prazosin 1 MG capsule Commonly known as: MINIPRESS Take 1 mg by mouth at bedtime.   varenicline 1 MG tablet Commonly known as: CHANTIX Take 1 mg by mouth 2 (two) times daily.        Discharge Exam: Filed Weights   05/14/23 0448  Weight: 81.6 kg   Constitutional:  Normal appearance. Non toxic-appearing.  HENT: Head Normocephalic and atraumatic.  Mucous membranes are moist.  Eyes:  Extraocular intact. Conjunctivae normal. Pupils are equal, round, and reactive to light.  Cardiovascular: Rate and Rhythm: Normal rate and regular rhythm.  Pulmonary: Non labored, symmetric rise of chest wall. No wheeze, no O2 requirement, occasional cough.  Musculoskeletal:  Normal range of motion.  Skin: warm and dry. not jaundiced.  Neurological: No focal deficit present. alert. Oriented. Psychiatric: Mood  and Affect congruent.    Condition at discharge: stable  The results of significant diagnostics from this hospitalization (including imaging, microbiology, ancillary and laboratory) are listed below for reference.   Imaging Studies: DG Chest Port 1 View Result Date: 05/14/2023 CLINICAL DATA:  Shortness of breath. Recently discharged after admission for pneumonia. EXAM: PORTABLE CHEST 1 VIEW COMPARISON:  PA and lateral chest 07/23/2022 FINDINGS: There is patchy airspace disease in the base of both lungs consistent with multilobar pneumonia. No pleural effusion is seen. The remaining lungs are clear. The cardiomediastinal silhouette and vascular pattern are normal. Thoracic cage intact. IMPRESSION: Patchy airspace disease in the base of both lungs consistent with multilobar pneumonia. Clinical correlation and radiographic follow-up to clearing recommended. Electronically Signed   By: Denman Fischer M.D.   On: 05/14/2023 05:38    Microbiology: Results for orders placed or performed during the hospital encounter of 05/14/23  Culture, blood (routine x 2)     Status: None (Preliminary result)   Collection Time: 05/14/23  4:53 AM   Specimen: BLOOD LEFT FOREARM  Result Value Ref Range Status   Specimen Description BLOOD LEFT FOREARM  Final   Special Requests   Final    BOTTLES DRAWN AEROBIC AND ANAEROBIC Blood Culture results  may not be optimal due to an inadequate volume of blood received in culture bottles   Culture   Final    NO GROWTH 2 DAYS Performed at [facility], [address] [place], [place] [address]    Report Status PENDING  Incomplete  Resp panel by RT-PCR (RSV, Flu A&B, Covid) Anterior Nasal Swab     Status: None   Collection Time: 05/14/23  4:53 AM   Specimen: Anterior Nasal Swab  Result Value Ref Range Status   SARS Coronavirus 2 by RT PCR NEGATIVE NEGATIVE Final    Comment: (NOTE) SARS-CoV-2 target nucleic acids are NOT DETECTED.  The SARS-CoV-2 RNA is generally  detectable in upper respiratory specimens during the acute phase of infection. The lowest concentration of SARS-CoV-2 viral copies this assay can detect is 138 copies/mL. A negative result does not preclude SARS-Cov-2 infection and should not be used as the sole basis for treatment or other patient management decisions. A negative result may occur with  improper specimen collection/handling, submission of specimen other than nasopharyngeal swab, presence of viral mutation(s) within the areas targeted by this assay, and inadequate number of viral copies(<138 copies/mL). A negative result must be combined with clinical observations, patient history, and epidemiological information. The expected result is Negative.  Fact Sheet for Patients:  BloggerCourse.com  Fact Sheet for Healthcare Providers:  SeriousBroker.it  This test is no t yet approved or cleared by the [place]  FDA and  has been authorized for detection and/or diagnosis of SARS-CoV-2 by FDA under an Emergency Use Authorization (EUA). This EUA will remain  in effect (meaning this test can be used) for the duration of the COVID-19 declaration under Section 564(b)(1) of the Act, 21 U.S.C.section 360bbb-3(b)(1), unless the authorization is terminated  or revoked sooner.       Influenza A by PCR NEGATIVE NEGATIVE Final   Influenza B by PCR NEGATIVE NEGATIVE Final    Comment: (NOTE) The Xpert Xpress SARS-CoV-2/FLU/RSV plus assay is intended as an aid in the diagnosis of influenza from Nasopharyngeal swab specimens and should not be used as a sole basis for treatment. Nasal washings and aspirates are unacceptable for Xpert Xpress SARS-CoV-2/FLU/RSV testing.  Fact Sheet for Patients: BloggerCourse.com  Fact Sheet for Healthcare Providers: SeriousBroker.it  This test is not yet approved or cleared by the [place]  FDA  and has been authorized for detection and/or diagnosis of SARS-CoV-2 by FDA under an Emergency Use Authorization (EUA). This EUA will remain in effect (meaning this test can be used) for the duration of the COVID-19 declaration under Section 564(b)(1) of the Act, 21 U.S.C. section 360bbb-3(b)(1), unless the authorization is terminated or revoked.     Resp Syncytial Virus by PCR NEGATIVE NEGATIVE Final    Comment: (NOTE) Fact Sheet for Patients: BloggerCourse.com  Fact Sheet for Healthcare Providers: SeriousBroker.it  This test is not yet approved or cleared by the [place]  FDA and has been authorized for detection and/or diagnosis of SARS-CoV-2 by FDA under an Emergency Use Authorization (EUA). This EUA will remain in effect (meaning this test can be used) for the duration of the COVID-19 declaration under Section 564(b)(1) of the Act, 21 U.S.C. section 360bbb-3(b)(1), unless the authorization is terminated or revoked.  Performed at [facility], [address] [place], [place] [address]   Culture, blood (routine x 2)     Status: None (Preliminary result)   Collection Time: 05/14/23  5:00 AM   Specimen: BLOOD  Result Value Ref Range Status  Specimen Description BLOOD LACTOSE FERMENTER  Final   Special Requests   Final    BOTTLES DRAWN AEROBIC AND ANAEROBIC Blood Culture adequate volume   Culture   Final    NO GROWTH 2 DAYS Performed at [facility], [address] [place], [place] [address]    Report Status PENDING  Incomplete  MRSA Next Gen by PCR, Nasal     Status: None   Collection Time: 05/14/23  2:35 PM   Specimen: Nasal Mucosa; Nasal Swab  Result Value Ref Range Status   MRSA by PCR Next Gen NOT DETECTED NOT DETECTED Final    Comment: (NOTE) The GeneXpert MRSA Assay (FDA approved for NASAL specimens only), is one component of a comprehensive MRSA colonization surveillance program. It is  not intended to diagnose MRSA infection nor to guide or monitor treatment for MRSA infections. Test performance is not FDA approved in patients less than 25 years old. Performed at [facility], [address] [place], [place] [address]     Labs: CBC: Recent Labs  Lab 05/14/23 [id number] 05/15/23 0342 05/16/23 0303  WBC 14.4* 21.3* 8.4  NEUTROABS 11.9*  --  4.0  HGB 10.9* 11.2* 10.9*  HCT 31.8* 33.2* 33.0*  MCV 82.2 82.8 83.3  PLT 318 365 272   Basic Metabolic Panel: Recent Labs  Lab 05/14/23 0453 05/15/23 0342 05/16/23 0303  NA 134* 138 135  K 3.2* 3.9 3.7  CL 101 106 107  CO2 20* 22 19*  GLUCOSE 134* 103* 108*  BUN 8 14 14   CREATININE 0.92 0.96 1.09*  CALCIUM 9.1 9.7 9.0  MG 1.7  --  1.8  PHOS  --   --  3.5   Liver Function Tests: Recent Labs  Lab 05/14/23 0453 05/16/23 0303  AST 16 12*  ALT 16 12  ALKPHOS 76 58  BILITOT 0.8 0.5  PROT 7.7 6.7  ALBUMIN 3.5 3.0*   CBG: No results for input(s): "GLUCAP" in the last 168 hours.  Discharge time spent: 32 minutes.  Signed: Lavene Penagos, DO [facility] 05/16/2023

## 2023-05-16 NOTE — TOC Transition Note (Signed)
 Transition of Care [facility]) - Discharge Note   Patient Details  Name: Tina Barrera MRN: [id number] Date of Birth: January 18, 1990  [facility] CM/SW Contact:  Crayton Docker, RN 05/16/2023, 5:38 PM   Clinical Narrative:     Alert received from RN Jennelle Mocha of request for transportation assistance. CM to patient's room regarding pending discharge. Patient requests taxi assistance to address, [address] [name], [place] [address].CM to patient's room regarding pending discharge. CM call to [facility] tax, scheduled taxi for main hospital entrance pick up at 1820, taxi # 3. Transportation sponsorship request form completed.   Final next level of care: Home/Self Care Barriers to Discharge: No Barriers Identified   Patient Goals and CMS Choice    Home/self care   Discharge Placement               Home/self care   Discharge Plan and Services Additional resources added to the After Visit Summary for      Home/self care    Social Drivers of Health (SDOH) Interventions SDOH Screenings   Food Insecurity: No Food Insecurity (05/14/2023)  Housing: Low Risk  (05/15/2023)  Transportation Needs: No Transportation Needs (05/14/2023)  Utilities: Not At Risk (05/14/2023)  Financial Resource Strain: Low Risk  (02/24/2023)   Received from [facility]  Social Connections: Socially Isolated (05/14/2023)  Tobacco Use: High Risk (05/14/2023)     Readmission Risk Interventions     No data to display

## 2023-05-19 LAB — CULTURE, BLOOD (ROUTINE X 2)
Culture: NO GROWTH
Culture: NO GROWTH
Special Requests: ADEQUATE

## 2023-05-26 ENCOUNTER — Emergency Department
Admission: EM | Admit: 2023-05-26 | Discharge: 2023-05-26 | Disposition: A | Discharge status: Home / Self Care | Payer: MEDICAID | Attending: Emergency Medicine | Admitting: Emergency Medicine

## 2023-05-26 ENCOUNTER — Other Ambulatory Visit: Payer: Self-pay

## 2023-05-26 DIAGNOSIS — T7840XA Allergy, unspecified, initial encounter: Secondary | ICD-10-CM

## 2023-05-26 DIAGNOSIS — R21 Rash and other nonspecific skin eruption: Secondary | ICD-10-CM

## 2023-05-26 DIAGNOSIS — L509 Urticaria, unspecified: Secondary | ICD-10-CM | POA: Diagnosis not present

## 2023-05-26 MED ORDER — CETIRIZINE HCL 10 MG PO TABS
10.0000 mg | ORAL_TABLET | Freq: Once | ORAL | Status: AC
  Administered 2023-05-26: 10 mg via ORAL
  Filled 2023-05-26: qty 1

## 2023-05-26 MED ORDER — METHYLPREDNISOLONE SODIUM SUCC 125 MG IJ SOLR
125.0000 mg | Freq: Once | INTRAMUSCULAR | Status: AC
Start: 2023-05-26 — End: 2023-05-26
  Administered 2023-05-26: 125 mg via INTRAMUSCULAR
  Filled 2023-05-26: qty 2

## 2023-05-26 MED ORDER — DIPHENHYDRAMINE HCL 50 MG/ML IJ SOLN
12.5000 mg | Freq: Once | INTRAMUSCULAR | Status: AC
Start: 2023-05-26 — End: 2023-05-26
  Administered 2023-05-26: 12.5 mg via INTRAVENOUS
  Filled 2023-05-26: qty 1

## 2023-05-26 MED ORDER — DIPHENHYDRAMINE HCL 50 MG/ML IJ SOLN: 25.0000 mg | Freq: Once | INTRAMUSCULAR | Status: DC

## 2023-05-26 MED ORDER — PREDNISONE 10 MG PO TABS: ORAL_TABLET | ORAL | 0 refills | Status: AC

## 2023-05-26 MED ORDER — CETIRIZINE HCL 10 MG PO TABS: 10.0000 mg | ORAL_TABLET | Freq: Every day | ORAL | 0 refills | Status: AC

## 2023-05-26 MED ORDER — SODIUM CHLORIDE 0.9 % IV BOLUS
1000.0000 mL | Freq: Once | INTRAVENOUS | Status: AC
  Administered 2023-05-26: 1000 mL via INTRAVENOUS

## 2023-05-26 MED ORDER — FAMOTIDINE IN NACL 20-0.9 MG/50ML-% IV SOLN
20.0000 mg | Freq: Once | INTRAVENOUS | Status: AC
  Administered 2023-05-26: 20 mg via INTRAVENOUS
  Filled 2023-05-26: qty 50

## 2023-05-26 MED ORDER — HYDROXYZINE HCL 25 MG PO TABS
25.0000 mg | ORAL_TABLET | Freq: Once | ORAL | Status: AC
  Administered 2023-05-26: 25 mg via ORAL
  Filled 2023-05-26: qty 1

## 2023-05-26 MED ORDER — DEXAMETHASONE SODIUM PHOSPHATE 10 MG/ML IJ SOLN
10.0000 mg | Freq: Once | INTRAMUSCULAR | Status: AC
  Administered 2023-05-26: 10 mg via INTRAMUSCULAR
  Filled 2023-05-26: qty 1

## 2023-05-26 NOTE — Discharge Instructions (Addendum)
 Lease call and schedule follow-up appointment with the allergy specialist at [facility].  Return to the emergency department immediately if you begin to feel that you are having trouble breathing or you are having facial swelling.  Continue taking Benadryl every 4-6 hours if needed for itching.

## 2023-05-26 NOTE — ED Triage Notes (Signed)
 Patient sent from [facility] for itchy rash on bilateral arms, neck and back.

## 2023-05-26 NOTE — ED Provider Notes (Signed)
 [facility] Provider Note    Event Date/Time   First MD Initiated Contact with Patient 05/26/23 1415     (approximate)   History   Rash   HPI  Tina Barrera is a 33 y.o. female presents to the ED with complaint of an itchy rash to the majority of her body that started last evening.  Patient has taken Benadryl twice without any relief.  She went to [facility] urgent care where she was sent to the emergency department for further evaluation.  Patient recently was treated for pneumonia with Levaquin  and steroids which she has finished prior to her rash.  She denies any difficulty breathing, swallowing or talking.  Patient has a history of hypertension, bipolar 1 disorder, chronic hepatitis C, polysubstance abuse, multifocal pneumonia.     Physical Exam   Triage Vital Signs: ED Triage Vitals  Encounter Vitals Group     BP 05/26/23 1405 116/81     Systolic BP Percentile --      Diastolic BP Percentile --      Pulse Rate 05/26/23 1405 (!) 112     Resp 05/26/23 1405 18     Temp 05/26/23 1405 98.1 F (36.7 C)     Temp Source 05/26/23 1405 Oral     SpO2 05/26/23 1405 99 %     Weight 05/26/23 1403 185 lb (83.9 kg)     Height 05/26/23 1403 5\' 5"  (1.651 m)     Head Circumference --      Peak Flow --      Pain Score 05/26/23 1403 0     Pain Loc --      Pain Education --      Exclude from Growth Chart --     Most recent vital signs: Vitals:   05/26/23 1405  BP: 116/81  Pulse: (!) 112  Resp: 18  Temp: 98.1 F (36.7 C)  SpO2: 99%     General: Awake, no distress.  Able to talk in complete sentences without any difficulty breathing. CV:  Good peripheral perfusion.  Heart regular rate rhythm. Resp:  Normal effort.  Lungs are clear bilaterally.  No wheezes are noted. Abd:  No distention.  Other:  Face, neck, trunk and extremities both upper and lower are with an irregular erythematous rash consistent with an urticarial rash.  No vesicles  or pustules are seen.   ED Results / Procedures / Treatments   Labs (all labs ordered are listed, but only abnormal results are displayed) Labs Reviewed - No data to display     PROCEDURES:  Critical Care performed:   Procedures   MEDICATIONS ORDERED IN ED: Medications  dexamethasone (DECADRON) injection 10 mg (has no administration in time range)  famotidine (PEPCID) IVPB 20 mg premix (has no administration in time range)  hydrOXYzine  (ATARAX ) tablet 25 mg (has no administration in time range)     IMPRESSION / MDM / ASSESSMENT AND PLAN / ED COURSE  I reviewed the triage vital signs and the nursing notes.   Differential diagnosis includes, but is not limited to, urticaria, allergic dermatitis, contact dermatitis unlikely but considered, viral exanthem.  33 year old female presents to the ED with complaint of a rash that began last evening that is extremely uncomfortable due to itching.  The remainder of her care is being turned over to [facility].  Staff is having difficulty starting an IV and IV team has been called.  Patient was given Atarax  25 mg p.o. at  this time.  Reassessed and no respiratory issues at this time.      Patient's presentation is most consistent with acute illness / injury with system symptoms.  FINAL CLINICAL IMPRESSION(S) / ED DIAGNOSES   Final diagnoses:  Rash and nonspecific skin eruption     Rx / DC Orders   ED Discharge Orders     None        Note:  This document was prepared using Dragon voice recognition software and may include unintentional dictation errors.   Stafford Eagles, PA-C 05/26/23 1546    Arline Bennett, MD 05/27/23 [phone]

## 2023-08-30 DIAGNOSIS — J300 Vasomotor rhinitis: Secondary | ICD-10-CM | POA: Insufficient documentation

## 2023-08-30 DIAGNOSIS — J4540 Moderate persistent asthma, uncomplicated: Secondary | ICD-10-CM | POA: Insufficient documentation

## 2023-12-12 ENCOUNTER — Ambulatory Visit
Admission: EM | Admit: 2023-12-12 | Discharge: 2023-12-12 | Disposition: A | Discharge status: Home / Self Care | Payer: MEDICAID | Attending: Family Medicine | Admitting: Family Medicine

## 2023-12-12 DIAGNOSIS — B009 Herpesviral infection, unspecified: Secondary | ICD-10-CM

## 2023-12-12 MED ORDER — VALACYCLOVIR HCL 1 G PO TABS: 1000.0000 mg | ORAL_TABLET | Freq: Two times a day (BID) | ORAL | 0 refills | Status: AC

## 2023-12-12 NOTE — Discharge Instructions (Addendum)
 Start valacyclovir  twice daily to help treat your genital herpes outbreak.  Follow-up with your PCP or gynecologist if your symptoms do not improve.  Please go to the ER for any worsening symptoms.  Hope you feel better soon!

## 2023-12-12 NOTE — ED Provider Notes (Signed)
 [facility]    CSN: [id number] Arrival date & time: 12/12/23  1434      History   Chief Complaint Chief Complaint  Patient presents with   Medication Refill    HPI Tina Barrera is a 33 y.o. female presents for medication refill.  Patient has a history of genital herpes and reports over the past 2 days she has been having an outbreak with painful sores.  States the same presentation as previous outbreaks.  Reports she has been on valacyclovir  in the past with good response.  Denies any other concerns at this time.   Medication Refill   Past Medical History:  Diagnosis Date   Anxiety    Depression    Herpes    Staph skin infection     Patient Active Problem List   Diagnosis Date Noted   Multifocal pneumonia 05/14/2023   Essential hypertension 05/14/2023   Sinus tachycardia 05/14/2023   Hypokalemia 05/14/2023   Sepsis due to cellulitis (HCC) 12/01/2019   Cellulitis and abscess of hand 11/30/2019   Polysubstance abuse (HCC) 11/30/2019   Cellulitis of right upper extremity 11/30/2019   Cellulitis of left upper extremity 11/30/2019   Bipolar 1 disorder (HCC) 11/30/2019   Chronic hepatitis C without hepatic coma (HCC) 11/30/2019    Past Surgical History:  Procedure Laterality Date   NO PAST SURGERIES      OB History   No obstetric history on file.      Home Medications    Prior to Admission medications   Medication Sig Start Date End Date Taking? Authorizing Provider  valACYclovir  (VALTREX ) 1000 MG tablet Take 1 tablet (1,000 mg total) by mouth 2 (two) times daily for 7 days. 12/12/23 12/19/23 Yes Lalania Haseman, Jodi R, NP  ADVAIR DISKUS 100-50 MCG/ACT AEPB Inhale 1 puff into the lungs 2 (two) times daily.    [provider]  albuterol  (VENTOLIN  HFA) 108 (90 Base) MCG/ACT inhaler Inhale 2 puffs into the lungs every 6 (six) hours as needed for wheezing or shortness of breath. 04/16/23 04/15/24  [provider]  amitriptyline  (ELAVIL ) 75  MG tablet Take 75 mg by mouth at bedtime. 12/02/22   [provider]  Buprenorphine  HCl-Naloxone  HCl 8-2 MG FILM Place 1 Film under the tongue 3 (three) times daily.     [provider]  cetirizine  (ZYRTEC  ALLERGY) 10 MG tablet Take 1 tablet (10 mg total) by mouth daily. 05/26/23 05/25/24  Triplett, Kirk B, FNP  FLUoxetine  (PROZAC ) 20 MG capsule Take 20 mg by mouth daily.     [provider]  hydrOXYzine  (ATARAX ) 50 MG tablet Take 50 mg by mouth 2 (two) times daily. 11/09/22   [provider]  lurasidone  (LATUDA ) 40 MG TABS tablet Take 40 mg by mouth daily with breakfast. 04/21/23   [provider]  metoprolol  succinate (TOPROL -XL) 50 MG 24 hr tablet Take 1 tablet by mouth daily. 02/24/23 02/24/24  [provider]  nystatin  (MYCOSTATIN ) 100000 UNIT/ML suspension Take 5 mLs (500,000 Units total) by mouth 4 (four) times daily. 03/20/21   Bernardino Ditch, NP  pantoprazole (PROTONIX) 20 MG tablet Take 20 mg by mouth 2 (two) times daily before a meal. 02/24/23 02/24/24  [provider]  prazosin  (MINIPRESS ) 1 MG capsule Take 1 mg by mouth at bedtime. 02/24/23   [provider]    Family History Family History  Problem Relation Age of Onset   Healthy Mother    Healthy Father     Social  History Social History   Tobacco Use   Smoking status: Every Day    Current packs/day: 1.00    Types: Cigarettes   Smokeless tobacco: Never  Vaping Use   Vaping status: Some Days  Substance Use Topics   Alcohol use: Not Currently   Drug use: Not Currently    Types: Methamphetamines    Comment: today 07/23/22     Allergies   Patient has no known allergies.   Review of Systems Review of Systems  Genitourinary:  Positive for vaginal pain.     Physical Exam Triage Vital Signs ED Triage Vitals  Encounter Vitals Group     BP 12/12/23 1521 113/77     Girls Systolic BP Percentile --      Girls Diastolic BP Percentile --      Boys  Systolic BP Percentile --      Boys Diastolic BP Percentile --      Pulse Rate 12/12/23 1521 93     Resp 12/12/23 1521 16     Temp 12/12/23 1521 98.3 F (36.8 C)     Temp Source 12/12/23 1521 Oral     SpO2 12/12/23 1521 99 %     Weight 12/12/23 1520 168 lb (76.2 kg)     Height --      Head Circumference --      Peak Flow --      Pain Score 12/12/23 1519 5     Pain Loc --      Pain Education --      Exclude from Growth Chart --    No data found.  Updated Vital Signs BP 113/77 (BP Location: Left Arm)   Pulse 93   Temp 98.3 F (36.8 C) (Oral)   Resp 16   Wt 168 lb (76.2 kg)   LMP 11/22/2023   SpO2 99%   BMI 27.96 kg/m   Visual Acuity Right Eye Distance:   Left Eye Distance:   Bilateral Distance:    Right Eye Near:   Left Eye Near:    Bilateral Near:     Physical Exam Vitals and nursing note reviewed.  Constitutional:      General: She is not in acute distress.    Appearance: Normal appearance. She is not ill-appearing.  HENT:     Head: Normocephalic and atraumatic.  Eyes:     Pupils: Pupils are equal, round, and reactive to light.  Cardiovascular:     Rate and Rhythm: Normal rate.  Pulmonary:     Effort: Pulmonary effort is normal.  Genitourinary:    Comments: Deferred Skin:    General: Skin is warm and dry.  Neurological:     General: No focal deficit present.     Mental Status: She is alert and oriented to person, place, and time.  Psychiatric:        Mood and Affect: Mood normal.        Behavior: Behavior normal.      UC Treatments / Results  Labs (all labs ordered are listed, but only abnormal results are displayed) Labs Reviewed - No data to display  EKG   Radiology No results found.  Procedures Procedures (including critical care time)  Medications Ordered in UC Medications - No data to display  Initial Impression / Assessment and Plan / UC Course  I have reviewed the triage vital signs and the nursing notes.  Pertinent labs &  imaging results that were available during my care of the patient were reviewed  by me and considered in my medical decision making (see chart for details).     Patient with known history of HSV-2 presents with outbreak and requesting antiviral medication.  Rx sent to pharmacy.  Advised to follow-up with PCP or gynecology if symptoms do not improve.  ER precautions reviewed. Final Clinical Impressions(s) / UC Diagnoses   Final diagnoses:  HSV-2 infection     Discharge Instructions      Start valacyclovir  twice daily to help treat your genital herpes outbreak.  Follow-up with your PCP or gynecologist if your symptoms do not improve.  Please go to the ER for any worsening symptoms.  Hope you feel better soon!    ED Prescriptions     Medication Sig Dispense Auth. Provider   valACYclovir  (VALTREX ) 1000 MG tablet Take 1 tablet (1,000 mg total) by mouth 2 (two) times daily for 7 days. 14 tablet Jeremiah Tarpley, Jodi R, NP      PDMP not reviewed this encounter.   Loreda Myla SAUNDERS, NP 12/12/23 [phone]

## 2023-12-12 NOTE — ED Triage Notes (Signed)
 Pt present medication refill for herpes outbreak.

## 2024-01-17 DIAGNOSIS — T7819XA Other adverse food reactions, not elsewhere classified, initial encounter: Secondary | ICD-10-CM | POA: Insufficient documentation

## 2024-01-17 DIAGNOSIS — L501 Idiopathic urticaria: Secondary | ICD-10-CM | POA: Insufficient documentation

## 2024-02-14 ENCOUNTER — Ambulatory Visit: Payer: MEDICAID

## 2024-02-14 ENCOUNTER — Ambulatory Visit
Admission: EM | Admit: 2024-02-14 | Discharge: 2024-02-14 | Disposition: A | Discharge status: Home / Self Care | Payer: MEDICAID | Source: Home / Self Care

## 2024-02-14 DIAGNOSIS — R601 Generalized edema: Secondary | ICD-10-CM | POA: Diagnosis not present

## 2024-02-14 DIAGNOSIS — R051 Acute cough: Secondary | ICD-10-CM | POA: Diagnosis not present

## 2024-02-14 NOTE — Discharge Instructions (Addendum)
 As we discussed, there is the potential for you to have cellulitis in all 4 of your extremities which is highly unusual.  The cause of your full body swelling is also unclear.  You need blood work, and possibly blood cultures, that we are unable to obtain here in urgent care and therefore we have referred you to the ER.  Please go to the ER at [facility] for further evaluation and treatment.  Please go now.

## 2024-02-14 NOTE — ED Provider Notes (Signed)
 " [facility]    CSN: [id number] Arrival date & time: 02/14/24  1025      History   Chief Complaint Chief Complaint  Patient presents with   Hand Problem   Leg Problem    HPI Jaeley Wiker is a 34 y.o. female.   HPI  34 year old female with past medical history significant for chronic hepatitis C status posttreatment, bipolar 1 disorder, history of IVDU, tobacco use disorder, recurrent cellulitis of upper and lower extremities, essential hypertension, moderate persistent asthma, and NSTEMI presents for evaluation of bilateral arm and leg swelling x 1 week.  They are also red and warm to touch.  Additionally, she has been experiencing a productive cough for white sputum with shortness of breath and dyspnea on exertion.  No fever.  She is endorsing whole body swelling.  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   Essential hypertension 05/14/2023   Hypokalemia 05/14/2023   Sinus tachycardia 12/16/2022   Essential tremor 12/16/2022   Adult BMI <19 kg/sq m 10/23/2021   Cellulitis and abscess of hand 11/30/2019   Cellulitis of left upper extremity 11/30/2019   Bipolar 1 disorder (HCC) 11/30/2019   Encounter for counseling 11/14/2019   History of disease 11/14/2019   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   Cellulitis of right upper extremity 03/28/2019   Tobacco dependence 03/28/2019   Tobacco use disorder 11/17/2017   Bilateral pneumonia 11/09/2017   Acute renal failure (ARF) 11/09/2017   NSTEMI (non-ST elevated myocardial infarction) (HCC) 11/09/2017   Pleural effusion 11/09/2017   Sepsis (HCC) 07/22/2015   Amphetamine use  disorder, 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      Home Medications    Prior to Admission medications  Medication Sig Start Date End Date Taking? Authorizing Provider  venlafaxine XR (EFFEXOR-XR) 150 MG 24 hr capsule SMARTSIG:By Mouth 02/03/24  Yes [provider]  ADVAIR DISKUS 100-50 MCG/ACT AEPB Inhale 1 puff into the lungs 2 (two) times daily.    [provider]  albuterol  (VENTOLIN  HFA) 108 (90 Base) MCG/ACT inhaler Inhale 2 puffs into the lungs every 6 (six) hours as needed for wheezing or shortness of breath. 04/16/23 04/15/24  [provider]  amitriptyline  (ELAVIL ) 75 MG tablet Take 75 mg by mouth at bedtime. 12/02/22   [provider]  Buprenorphine  HCl-Naloxone  HCl 8-2 MG FILM Place 1 Film under the tongue 3 (three) times daily.     [provider]  cetirizine  (ZYRTEC  ALLERGY) 10 MG tablet Take 1 tablet (10 mg total) by mouth daily. 05/26/23 05/25/24  Triplett, Kirk B, FNP  FLUoxetine  (PROZAC ) 20 MG capsule Take 20 mg by mouth daily.     [provider]  hydrOXYzine  (ATARAX ) 50 MG tablet Take 50 mg by mouth 2 (two) times daily. 11/09/22   [provider]  lurasidone  (  LATUDA ) 40 MG TABS tablet Take 40 mg by mouth daily with breakfast. 04/21/23   [provider]  metoprolol  succinate (TOPROL -XL) 50 MG 24 hr tablet Take 1 tablet by mouth daily. 02/24/23 02/24/24  [provider]  nystatin  (MYCOSTATIN ) 100000 UNIT/ML suspension Take 5 mLs (500,000 Units total) by mouth 4 (four) times daily. 03/20/21   Bernardino Ditch, NP  pantoprazole (PROTONIX) 20 MG tablet Take 20 mg by mouth 2 (two) times daily before a meal. 02/24/23 02/24/24   [provider]  prazosin  (MINIPRESS ) 1 MG capsule Take 1 mg by mouth at bedtime. 02/24/23   [provider]    Family History Family History  Problem Relation Age of Onset   Healthy Mother    Healthy Father     Social History Social History[1]   Allergies   Patient has no known allergies.   Review of Systems Review of Systems  Constitutional:  Negative for fever.  Respiratory:  Positive for cough and shortness of breath. Negative for wheezing.   Cardiovascular:  Positive for leg swelling. Negative for chest pain.  Skin:  Positive for color change.     Physical Exam Triage Vital Signs ED Triage Vitals  Encounter Vitals Group     BP      Girls Systolic BP Percentile      Girls Diastolic BP Percentile      Boys Systolic BP Percentile      Boys Diastolic BP Percentile      Pulse      Resp      Temp      Temp src      SpO2      Weight      Height      Head Circumference      Peak Flow      Pain Score      Pain Loc      Pain Education      Exclude from Growth Chart    No data found.  Updated Vital Signs BP 111/70 (BP Location: Right Arm)   Pulse 90   Temp 99.1 F (37.3 C) (Oral)   Resp 16   Wt 201 lb 6.4 oz (91.4 kg)   LMP 01/16/2024 (Approximate)   SpO2 95%   BMI 33.51 kg/m   Visual Acuity Right Eye Distance:   Left Eye Distance:   Bilateral Distance:    Right Eye Near:   Left Eye Near:    Bilateral Near:     Physical Exam Vitals and nursing note reviewed.  Constitutional:      Appearance: Normal appearance. She is not ill-appearing.  HENT:     Head: Normocephalic and atraumatic.  Cardiovascular:     Rate and Rhythm: Normal rate and regular rhythm.     Pulses: Normal pulses.     Heart sounds: Normal heart sounds. No murmur heard.    No friction rub. No gallop.  Pulmonary:     Effort: Pulmonary effort is normal.     Breath sounds: Normal breath sounds. No wheezing, rhonchi or rales.  Musculoskeletal:     Right lower  leg: Edema present.     Left lower leg: Edema present.     Comments: 1+ pitting edema in bilateral lower extremities.  Skin:    General: Skin is warm and dry.     Capillary Refill: Capillary refill takes less than 2 seconds.     Findings: Erythema present.     Comments: Erythema to both hands and  bilateral lower extremities  Neurological:     General: No focal deficit present.     Mental Status: She is alert and oriented to person, place, and time.      UC Treatments / Results  Labs (all labs ordered are listed, but only abnormal results are displayed) Labs Reviewed - No data to display  EKG Normal sinus rhythm with a ventricular rate of 61 bpm PR interval 132 ms QRS duration 64 ms QT/QTc 394/396 ms No ST or T wave abnormalities noted.  Radiology DG Chest 2 View Result Date: 02/14/2024 CLINICAL DATA:  Cough and shortness of breath. EXAM: CHEST - 2 VIEW COMPARISON:  05/14/2023 FINDINGS: Midline trachea. Normal heart size and mediastinal contours. No pleural effusion or pneumothorax. Clear lungs. IMPRESSION: No active cardiopulmonary disease. Electronically Signed   By: Rockey Kilts M.D.   On: 02/14/2024 12:57    Procedures Procedures (including critical care time)  Medications Ordered in UC Medications - No data to display  Initial Impression / Assessment and Plan / UC Course  I have reviewed the triage vital signs and the nursing notes.  Pertinent labs & imaging results that were available during my care of the patient were reviewed by me and considered in my medical decision making (see chart for details).   Patient is a nontoxic-appearing 34 year old female presenting for evaluation of whole body swelling with cough and shortness of breath as well as redness to bilateral hands and legs that have been going on for the past week.  She has a history of recurrent cellulitis and she suspects that this is what this is however she has not had the swelling of the face or body  previously.  She has had some shortness of breath and dyspnea on exertion but she denies any chest pain.  The patient had an NSTEMI as a result of right lower lobe pneumonia and ARDS which resulted in her being admitted to [facility] on 11/08/2017.  This was also associated with acute renal failure.  She was intubated and ventilated for period of time in the MICU and was later transferred to [facility] and eventually discharged home.  She reports she has never seen cardiology regarding her NSTEMI.  In the exam room, she is able to speak in full sentences without dyspnea or tachypnea.  Her lungs sound clear to auscultation in all fields.  However, given her DOE and SOB I will obtain a chest radiograph to evaluate for any acute cardiopulmonary pathology.  I will also obtain an EKG to evaluate for any potential cardiac cause of her swelling.    As you can see in the images above, there is erythema to the bilateral lower extremities as well as both of her hands.  The area of erythema is hot to touch.  The edema is 1+ pitting in both of her lower extremities.  Nonpitting in her hands.  Additionally, I will order a CBC, BMP, and proBNP.  Chest x-ray independently reviewed and evaluated by me.  Impression: Lung fields are well aerated without evidence of infiltrate or effusion.  Cardiac mediastinal silhouette is normal.  Radiology overread is pending.  EKG shows normal sinus rhythm without any ST or T wave abnormalities.  Staff has been unable to obtain blood work.  Given that the patient is experiencing potentially cellulitis and for extremities at once, and ever has, I feel she needs to be evaluated in the ER and have blood work that we are unable to obtain.  I  spoke with the patient and advised her that I feel she is to be evaluated in the ER.  I did advise her to go back to [facility] given that she has been seen there in the past.  She reports that she has not had any IV drug use in several years and she has been sober  from other substances for the past several months.  However, given the potential for cellulitis in all 4 extremities there is a concern that she has a blood-borne infection and she needs further workup that we are unable to provide.   Final Clinical Impressions(s) / UC Diagnoses   Final diagnoses:  Acute cough  Generalized edema     Discharge Instructions      As we discussed, there is the potential for you to have cellulitis in all 4 of your extremities which is highly unusual.  The cause of your full body swelling is also unclear.  You need blood work, and possibly blood cultures, that we are unable to obtain here in urgent care and therefore we have referred you to the ER.  Please go to the ER at [facility] for further evaluation and treatment.  Please go now.     ED Prescriptions   None    PDMP not reviewed this encounter.     [1]  Social History Tobacco Use   Smoking status: Every Day    Current packs/day: 1.00    Types: Cigarettes   Smokeless tobacco: Never  Vaping Use   Vaping status: Some Days  Substance Use Topics   Alcohol use: Not Currently   Drug use: Not Currently    Types: Methamphetamines    Comment: today 07/23/22     Bernardino Ditch, NP 02/14/24 1320  "
